# Patient Record
Sex: Male | Born: 1945 | Race: White | Hispanic: No | Smoking: Never smoker
Health system: Southern US, Community
[De-identification: ages and names within clinical notes are randomized; demographics above are authoritative.]

## PROBLEM LIST (undated history)

## (undated) DIAGNOSIS — R269 Unspecified abnormalities of gait and mobility: Secondary | ICD-10-CM

## (undated) DIAGNOSIS — K5732 Diverticulitis of large intestine without perforation or abscess without bleeding: Secondary | ICD-10-CM

## (undated) DIAGNOSIS — C61 Malignant neoplasm of prostate: Secondary | ICD-10-CM

## (undated) DIAGNOSIS — L309 Dermatitis, unspecified: Secondary | ICD-10-CM

## (undated) DIAGNOSIS — T7840XA Allergy, unspecified, initial encounter: Secondary | ICD-10-CM

## (undated) DIAGNOSIS — M545 Low back pain, unspecified: Secondary | ICD-10-CM

## (undated) DIAGNOSIS — M1990 Unspecified osteoarthritis, unspecified site: Secondary | ICD-10-CM

## (undated) DIAGNOSIS — Z1509 Genetic susceptibility to other malignant neoplasm: Secondary | ICD-10-CM

## (undated) DIAGNOSIS — F419 Anxiety disorder, unspecified: Secondary | ICD-10-CM

## (undated) DIAGNOSIS — R159 Full incontinence of feces: Secondary | ICD-10-CM

## (undated) DIAGNOSIS — S62102A Fracture of unspecified carpal bone, left wrist, initial encounter for closed fracture: Secondary | ICD-10-CM

## (undated) DIAGNOSIS — D239 Other benign neoplasm of skin, unspecified: Secondary | ICD-10-CM

## (undated) DIAGNOSIS — Z860100 Personal history of colon polyps, unspecified: Secondary | ICD-10-CM

## (undated) DIAGNOSIS — G629 Polyneuropathy, unspecified: Secondary | ICD-10-CM

## (undated) DIAGNOSIS — K5790 Diverticulosis of intestine, part unspecified, without perforation or abscess without bleeding: Secondary | ICD-10-CM

## (undated) DIAGNOSIS — K5792 Diverticulitis of intestine, part unspecified, without perforation or abscess without bleeding: Secondary | ICD-10-CM

## (undated) DIAGNOSIS — E669 Obesity, unspecified: Secondary | ICD-10-CM

## (undated) DIAGNOSIS — D485 Neoplasm of uncertain behavior of skin: Secondary | ICD-10-CM

## (undated) DIAGNOSIS — L570 Actinic keratosis: Secondary | ICD-10-CM

## (undated) DIAGNOSIS — Z800 Family history of malignant neoplasm of digestive organs: Secondary | ICD-10-CM

## (undated) DIAGNOSIS — F329 Major depressive disorder, single episode, unspecified: Secondary | ICD-10-CM

## (undated) DIAGNOSIS — E785 Hyperlipidemia, unspecified: Secondary | ICD-10-CM

## (undated) DIAGNOSIS — M47816 Spondylosis without myelopathy or radiculopathy, lumbar region: Secondary | ICD-10-CM

## (undated) DIAGNOSIS — K648 Other hemorrhoids: Secondary | ICD-10-CM

## (undated) DIAGNOSIS — K759 Inflammatory liver disease, unspecified: Secondary | ICD-10-CM

## (undated) DIAGNOSIS — H9190 Unspecified hearing loss, unspecified ear: Secondary | ICD-10-CM

## (undated) DIAGNOSIS — J189 Pneumonia, unspecified organism: Secondary | ICD-10-CM

## (undated) DIAGNOSIS — C4490 Unspecified malignant neoplasm of skin, unspecified: Secondary | ICD-10-CM

## (undated) DIAGNOSIS — R51 Headache: Secondary | ICD-10-CM

## (undated) DIAGNOSIS — L989 Disorder of the skin and subcutaneous tissue, unspecified: Secondary | ICD-10-CM

## (undated) DIAGNOSIS — Z973 Presence of spectacles and contact lenses: Secondary | ICD-10-CM

## (undated) DIAGNOSIS — I493 Ventricular premature depolarization: Secondary | ICD-10-CM

## (undated) DIAGNOSIS — G63 Polyneuropathy in diseases classified elsewhere: Secondary | ICD-10-CM

## (undated) DIAGNOSIS — F32A Depression, unspecified: Secondary | ICD-10-CM

## (undated) HISTORY — DX: Diverticulitis of intestine, part unspecified, without perforation or abscess without bleeding: K57.92

## (undated) HISTORY — DX: Polyneuropathy in diseases classified elsewhere: G63

## (undated) HISTORY — DX: Family history of malignant neoplasm of digestive organs: Z80.0

## (undated) HISTORY — DX: Personal history of colonic polyps: Z86.010

## (undated) HISTORY — DX: Obesity, unspecified: E66.9

## (undated) HISTORY — DX: Hyperlipidemia, unspecified: E78.5

## (undated) HISTORY — PX: NASAL SEPTOPLASTY W/ TURBINOPLASTY: SHX2070

## (undated) HISTORY — DX: Spondylosis without myelopathy or radiculopathy, lumbar region: M47.816

## (undated) HISTORY — DX: Disorder of the skin and subcutaneous tissue, unspecified: L98.9

## (undated) HISTORY — PX: ANKLE SURGERY: SHX546

## (undated) HISTORY — DX: Neoplasm of uncertain behavior of skin: D48.5

## (undated) HISTORY — PX: PROSTATE BIOPSY: SHX241

## (undated) HISTORY — DX: Headache: R51

## (undated) HISTORY — DX: Other benign neoplasm of skin, unspecified: D23.9

## (undated) HISTORY — DX: Low back pain, unspecified: M54.50

## (undated) HISTORY — DX: Other hemorrhoids: K64.8

## (undated) HISTORY — PX: TONSILLECTOMY: SUR1361

## (undated) HISTORY — DX: Ventricular premature depolarization: I49.3

## (undated) HISTORY — DX: Inflammatory liver disease, unspecified: K75.9

## (undated) HISTORY — PX: COLONOSCOPY W/ POLYPECTOMY: SHX1380

## (undated) HISTORY — DX: Personal history of colon polyps, unspecified: Z86.0100

## (undated) HISTORY — DX: Diverticulosis of intestine, part unspecified, without perforation or abscess without bleeding: K57.90

## (undated) HISTORY — DX: Major depressive disorder, single episode, unspecified: F32.9

## (undated) HISTORY — DX: Actinic keratosis: L57.0

## (undated) HISTORY — DX: Pneumonia, unspecified organism: J18.9

## (undated) HISTORY — DX: Allergy, unspecified, initial encounter: T78.40XA

## (undated) HISTORY — DX: Unspecified malignant neoplasm of skin, unspecified: C44.90

## (undated) HISTORY — PX: UPPER GASTROINTESTINAL ENDOSCOPY: SHX188

## (undated) HISTORY — PX: COLONOSCOPY: SHX174

## (undated) HISTORY — DX: Genetic susceptibility to other malignant neoplasm: Z15.09

## (undated) HISTORY — DX: Malignant neoplasm of prostate: C61

## (undated) HISTORY — DX: Diverticulitis of large intestine without perforation or abscess without bleeding: K57.32

## (undated) HISTORY — DX: Dermatitis, unspecified: L30.9

## (undated) HISTORY — DX: Unspecified abnormalities of gait and mobility: R26.9

## (undated) HISTORY — DX: Low back pain: M54.5

## (undated) HISTORY — DX: Polyneuropathy, unspecified: G62.9

## (undated) HISTORY — DX: Full incontinence of feces: R15.9

## (undated) HISTORY — PX: SQUAMOUS CELL CARCINOMA EXCISION: SHX2433

## (undated) HISTORY — DX: Depression, unspecified: F32.A

## (undated) HISTORY — DX: Unspecified osteoarthritis, unspecified site: M19.90

---

## 1980-08-30 HISTORY — PX: HERNIA REPAIR: SHX51

## 1986-08-30 HISTORY — PX: UMBILICAL HERNIA REPAIR: SHX196

## 2000-12-14 ENCOUNTER — Ambulatory Visit (HOSPITAL_COMMUNITY): Admission: RE | Admit: 2000-12-14 | Discharge: 2000-12-14 | Payer: Self-pay | Admitting: Gastroenterology

## 2000-12-14 ENCOUNTER — Encounter (INDEPENDENT_AMBULATORY_CARE_PROVIDER_SITE_OTHER): Payer: Self-pay | Admitting: Specialist

## 2004-03-30 ENCOUNTER — Encounter: Admission: RE | Admit: 2004-03-30 | Discharge: 2004-03-30 | Payer: Self-pay | Admitting: Family Medicine

## 2007-07-20 ENCOUNTER — Ambulatory Visit: Payer: Self-pay | Admitting: Internal Medicine

## 2007-08-01 ENCOUNTER — Ambulatory Visit: Payer: Self-pay | Admitting: Internal Medicine

## 2007-08-01 DIAGNOSIS — K648 Other hemorrhoids: Secondary | ICD-10-CM | POA: Insufficient documentation

## 2007-09-23 DIAGNOSIS — K625 Hemorrhage of anus and rectum: Secondary | ICD-10-CM

## 2007-09-23 DIAGNOSIS — E785 Hyperlipidemia, unspecified: Secondary | ICD-10-CM

## 2007-09-23 DIAGNOSIS — R159 Full incontinence of feces: Secondary | ICD-10-CM | POA: Insufficient documentation

## 2007-09-23 DIAGNOSIS — C61 Malignant neoplasm of prostate: Secondary | ICD-10-CM

## 2008-12-27 ENCOUNTER — Encounter: Admission: RE | Admit: 2008-12-27 | Discharge: 2008-12-27 | Payer: Self-pay | Admitting: Family Medicine

## 2009-01-13 ENCOUNTER — Encounter: Admission: RE | Admit: 2009-01-13 | Discharge: 2009-01-13 | Payer: Self-pay | Admitting: Family Medicine

## 2010-06-25 ENCOUNTER — Encounter: Admission: RE | Admit: 2010-06-25 | Discharge: 2010-06-25 | Payer: Self-pay | Admitting: Family Medicine

## 2010-11-05 ENCOUNTER — Other Ambulatory Visit: Payer: Self-pay | Admitting: Family Medicine

## 2010-11-05 DIAGNOSIS — K5792 Diverticulitis of intestine, part unspecified, without perforation or abscess without bleeding: Secondary | ICD-10-CM

## 2010-11-06 ENCOUNTER — Other Ambulatory Visit: Payer: Self-pay | Admitting: Family Medicine

## 2010-11-06 ENCOUNTER — Ambulatory Visit
Admission: RE | Admit: 2010-11-06 | Discharge: 2010-11-06 | Disposition: A | Discharge status: Home / Self Care | Payer: Medicare Other | Source: Ambulatory Visit | Attending: Family Medicine | Admitting: Family Medicine

## 2010-11-06 DIAGNOSIS — K5730 Diverticulosis of large intestine without perforation or abscess without bleeding: Secondary | ICD-10-CM

## 2010-11-06 DIAGNOSIS — K5792 Diverticulitis of intestine, part unspecified, without perforation or abscess without bleeding: Secondary | ICD-10-CM

## 2010-11-06 MED ORDER — IOHEXOL 300 MG/ML  SOLN
125.0000 mL | Freq: Once | INTRAMUSCULAR | Status: AC | PRN
  Administered 2010-11-06: 125 mL via INTRAVENOUS

## 2010-11-09 ENCOUNTER — Other Ambulatory Visit: Payer: Self-pay

## 2010-11-26 ENCOUNTER — Ambulatory Visit
Admission: RE | Admit: 2010-11-26 | Discharge: 2010-11-26 | Disposition: A | Discharge status: Home / Self Care | Payer: Medicare Other | Source: Ambulatory Visit | Attending: Family Medicine | Admitting: Family Medicine

## 2010-11-26 ENCOUNTER — Encounter: Payer: Self-pay | Admitting: Internal Medicine

## 2010-11-26 ENCOUNTER — Other Ambulatory Visit: Payer: Self-pay | Admitting: Family Medicine

## 2010-11-26 DIAGNOSIS — M545 Low back pain, unspecified: Secondary | ICD-10-CM

## 2010-11-26 DIAGNOSIS — R109 Unspecified abdominal pain: Secondary | ICD-10-CM

## 2010-12-01 NOTE — Letter (Signed)
Summary: New Patient letter  [facility]  [address] [place], [place] [address]   Phone: [phone]  Fax: [phone]       11/26/2010 MRN: [id number]  [facility] [address] [place], [place]  [address]  Dear Ronald Edwards,  Welcome to the Gastroenterology Division at [facility].    You are scheduled to see Dr.  Leone Payor on Jan 06, 2011 at 8:45am on the 3rd floor at [facility], [address]  We ask that you try to arrive at our  office 15 minutes prior to your appointment time to allow for check-in.  We would like you to complete the enclosed self-administered evaluation form prior to your visit and bring it with you on the day of your appointment.  We will review it with you.  Also, please bring a complete list of all your medications or, if you prefer, bring the medication bottles and we will list them.  Please bring your insurance card so that we may make a copy of it.  If your insurance requires a referral to see a specialist, please bring your referral form from your primary care physician.  Co-payments are due at the time of your visit and may be paid by cash, check or credit card.     Your office visit will consist of a consult with your physician (includes a physical exam), any laboratory testing he/she may order, scheduling of any necessary diagnostic testing (e.g. x-ray, ultrasound, CT-scan), and scheduling of a procedure (e.g. Endoscopy, Colonoscopy) if required.  Please allow enough time on your schedule to allow for any/all of these possibilities.    If you cannot keep your appointment, please call [phone] to cancel or reschedule prior to your appointment date.  This allows [place] the opportunity to schedule an appointment for another patient in need of care.  If you do not cancel or reschedule by 5 p.m. the business day prior to your appointment date, you will be charged a $50.00 late cancellation/no-show fee.    Thank you for choosing  [facility] for your medical needs.  We appreciate the opportunity to care for you.  Please visit [place] at our website  to learn more about our practice.                     Sincerely,                                                             The Gastroenterology Division

## 2010-12-07 ENCOUNTER — Other Ambulatory Visit (HOSPITAL_COMMUNITY): Payer: Self-pay | Admitting: Family Medicine

## 2010-12-07 DIAGNOSIS — I493 Ventricular premature depolarization: Secondary | ICD-10-CM

## 2010-12-09 ENCOUNTER — Ambulatory Visit (HOSPITAL_COMMUNITY): Payer: Medicare Other | Attending: Family Medicine | Admitting: Radiology

## 2010-12-09 DIAGNOSIS — I493 Ventricular premature depolarization: Secondary | ICD-10-CM

## 2010-12-09 DIAGNOSIS — I4949 Other premature depolarization: Secondary | ICD-10-CM

## 2010-12-09 DIAGNOSIS — I379 Nonrheumatic pulmonary valve disorder, unspecified: Secondary | ICD-10-CM | POA: Insufficient documentation

## 2010-12-09 DIAGNOSIS — E785 Hyperlipidemia, unspecified: Secondary | ICD-10-CM | POA: Insufficient documentation

## 2010-12-09 DIAGNOSIS — I079 Rheumatic tricuspid valve disease, unspecified: Secondary | ICD-10-CM | POA: Insufficient documentation

## 2010-12-10 ENCOUNTER — Encounter (HOSPITAL_COMMUNITY): Payer: Self-pay | Admitting: Family Medicine

## 2011-01-06 ENCOUNTER — Encounter: Payer: Self-pay | Admitting: Internal Medicine

## 2011-01-06 ENCOUNTER — Ambulatory Visit (INDEPENDENT_AMBULATORY_CARE_PROVIDER_SITE_OTHER): Payer: Medicare Other | Admitting: Internal Medicine

## 2011-01-06 VITALS — BP 128/74 | HR 62 | Ht 74.0 in | Wt 270.0 lb

## 2011-01-06 DIAGNOSIS — R159 Full incontinence of feces: Secondary | ICD-10-CM

## 2011-01-06 DIAGNOSIS — K5732 Diverticulitis of large intestine without perforation or abscess without bleeding: Secondary | ICD-10-CM

## 2011-01-06 DIAGNOSIS — Z860100 Personal history of colon polyps, unspecified: Secondary | ICD-10-CM

## 2011-01-06 DIAGNOSIS — C61 Malignant neoplasm of prostate: Secondary | ICD-10-CM

## 2011-01-06 DIAGNOSIS — L259 Unspecified contact dermatitis, unspecified cause: Secondary | ICD-10-CM

## 2011-01-06 DIAGNOSIS — R1032 Left lower quadrant pain: Secondary | ICD-10-CM

## 2011-01-06 DIAGNOSIS — L309 Dermatitis, unspecified: Secondary | ICD-10-CM | POA: Insufficient documentation

## 2011-01-06 HISTORY — DX: Diverticulitis of large intestine without perforation or abscess without bleeding: K57.32

## 2011-01-06 MED ORDER — PEG-KCL-NACL-NASULF-NA ASC-C 100 G PO SOLR: 1.0000 | Freq: Once | ORAL | Status: AC

## 2011-01-06 NOTE — Assessment & Plan Note (Signed)
We know from CT scanning and 20/10 in 2011 he has had this problem. He could have occult diverticulitis that persists. This could be part of his problem. I'm not sure it explains everything come in particular the fatigue may be due to his work schedule and age and overall fitness or lack thereof.  A colonoscopy will be undertaken to try to understand things better. Since he had an adenomatous polyp or 2 in the past, a full colonoscopy rather than sigmoidoscopy will be performed. If he does not have polyps this time I would recommend a routine repeat in 10 years.  I advised him that if he has ongoing diverticulitis, surgical resection on an elective basis would be a reasonable option. We will see what is found and determine a plan after that.

## 2011-01-06 NOTE — Assessment & Plan Note (Signed)
Likely to prescribe a nystatin triamcinolone ointment. He may need a barrier cream or ointment as well. A skin biopsy could be indicated depending upon the clinical course.

## 2011-01-06 NOTE — Assessment & Plan Note (Signed)
Diminutive adenoma or 2 in 2002.

## 2011-01-06 NOTE — Progress Notes (Signed)
Subjective:    Patient ID: Ronald Edwards, male    DOB: 04/20/1946, 65 y.o.   MRN: [id number]  HPI Pleasant 65 year old white man known to me from previous colonoscopy. I am asked to evaluate him because of recurrent abdominal pain and diverticulitis as well as fatigue. He did not identify diverticulosis on December 65 a colonoscopy. Patient had CT documented diverticulitis in 2010 and then 2011, which responded to Cipro though it took some extended treatment. A prostate biopsy at the beginning of this year and then had some fever root type syndrome nausea and vomiting and left lower quadrant pain. He was treated with Cipro, CT scan March 2012 did not show diverticulitis but severe diverticulosis, SI joint sclerosis and fat-filled inguinal hernias. He felt better after a few days. 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 He has been under some stress in that he is trying to sell his business and land though his son is part of the business and will need a job still. Wife has been busy with doctor visits as well. There have been some other family stresses.  He continues this for chronic occasional rectal bleeding. He has known hemorrhoids. He has chronic occasional fecal soiling, and a chronic perianal rash.  Overall he does feel it has improved and is not in any pain at this time.  Prior office visits with PCP, radiology reports, laboratory tests reviewed.  Past Medical History  Diagnosis Date  . Allergic rhinitis   . Skin lesion     atypical, left lower leg  . DJD (degenerative joint disease) of lumbar spine   . Diverticulitis   . Diverticulosis   . Hearing loss   . History of colonic polyps   . Hyperlipemia   . Osteoarthritis   . Prostate cancer   . 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 Low back  pain   . Malaise and fatigue   . Actinic keratosis   . Internal hemorrhoids   . Fecal incontinence   . Pneumonia    Past Surgical History  Procedure Date  . Hernia repair 1982    left  . Umbilical hernia repair 1988  . Prostate biopsy   . Colonoscopy 08/01/2007    internal hemorrhoids  . Colonoscopy w/ polypectomy 2002    diminutive adenoma(s)    reports that he has never smoked. He has never used smokeless tobacco. He reports that he drinks alcohol. He reports that he does not use illicit drugs. family history includes Allergic rhinitis in his daughter; Anxiety disorder in his sister; COPD in his mother; Coronary artery disease in his mother; Diabetes in his mother; and Lung cancer in his father.  There is no history of Colon cancer. Allergies  Allergen Reactions  . Lovastatin Other (See Comments)    fatigue  . Ondansetron Hcl Other (See Comments)    headache  . Penicillins Swelling and Other (See Comments)     sweating  . Pravastatin Other (See Comments)    Arthralgia, Myalgias  . Simvastatin Other (See Comments)     Myalgias         Review of Systems Allergies, osteoarthritis and back pain, night sweats and muscle pains. All other systems are negative or as reviewed in the history of present illness.  Objective:   Physical Exam Obese but well-developed well-nourished white man no acute distress The eyes are anicteric pupils round and Mouth dentition in good repair Neck supple no mass Lungs clear Heart S1-S2 no rubs murmurs or gallops Rectal exam shows a perianal erythematous rash, quite intense. Resting tone is slightly decreased. There is a good squeeze overall. No mass. No significant stool. Lower extremities free of edema. Awake alert and oriented x3. Mood and affect are appropriate.       Assessment & Plan:

## 2011-01-06 NOTE — Assessment & Plan Note (Signed)
Not a problem right now but he said recurrent episodes suggestive of diverticulitis and proven to be so on CT at least two out of three occasions. Colonoscopy will evaluate further. I did not note diverticulosis on his last colonoscopy in 2010.

## 2011-01-06 NOTE — Assessment & Plan Note (Signed)
This remains a problem and is probably causing his perianal dermatitis. Await colonoscopy and rectal exam at that time to reassess.

## 2011-01-06 NOTE — Patient Instructions (Signed)
Colonoscopy A colonoscopy is an exam to evaluate your entire colon. In this exam, your colon is cleansed. A long fiberoptic tube is inserted through your rectum and into your colon. The fiberoptic scope (endoscope) is a long bundle of enclosed and very flexible fibers. These fibers transmit light to the area examined and send images from that area to your caregiver. Discomfort is usually minimal. You may be given a drug to help you sleep (sedative) during or prior to the procedure. This exam helps to detect lumps (tumors), polyps, inflammation, and areas of bleeding. Your caregiver may also take a small piece of tissue (biopsy) that will be examined under a microscope. BEFORE THE PROCEDURE  A clear liquid diet may be required for 2 days before the exam.   Liquid injections (enemas) or laxatives may be required.   A large amount of electrolyte solution may be given to you to drink over a short period of time. This solution is used to clean out your colon.   You should be present 1 prior to your procedure or as directed by your caregiver.   Check in at the admissions desk to fill out necessary forms if not preregistered. There will be consent forms to sign prior to the procedure. If accompanied by friends or family, there is a waiting area for them while you are having your procedure.  LET YOUR CAREGIVER KNOW ABOUT:  Allergies to food or medicine.  Medicines taken, including vitamins, herbs, eyedrops, over-the-counter medicines, and creams.   Use of steroids (by mouth or creams).   Previous problems with anesthetics or numbing medicines.   History of bleeding problems or blood clots.  Previous surgery.   Other health problems, including diabetes and kidney problems.   Possibility of pregnancy, if this applies.   AFTER THE PROCEDURE  If you received a sedative and/or pain medicine, you will need to arrange for someone to drive you home.   Occasionally, there is a little blood passed  with the first bowel movement. DO NOT be concerned.  HOME CARE INSTRUCTIONS  It is not unusual to pass moderate amounts of gas and experience mild abdominal cramping following the procedure. This is due to air being used to inflate your colon during the exam. Walking or a warm pack on your belly (abdomen) may help.   You may resume all normal meals and activities after sedatives and medicines have worn off.   Only take over-the-counter or prescription medicines for pain, discomfort, or fever as directed by your caregiver. DO NOT use aspirin or blood thinners if a biopsy was taken. Consult your caregiver for medicine usage if biopsies were taken.  FINDING OUT THE RESULTS OF YOUR TEST Not all test results are available during your visit. If your test results are not back during the visit, make an appointment with your caregiver to find out the results. Do not assume everything is normal if you have not heard from your caregiver or the medical facility. It is important for you to follow up on all of your test results. SEEK IMMEDIATE MEDICAL CARE IF:  You pass large blood clots or fill a toilet with blood following the procedure. This may also occur 10 to 14 days following the procedure. This is more likely if a biopsy was taken.   You develop abdominal pain that keeps getting worse and cannot be relieved with medicine.  Document Released: 08/13/2000 Document Re-Released: 11/10/2009 [facility] Patient Information 2011 [place], [place].  Your colonoscopy is scheduled on 6//  at 1:30pm We are sending in your MoviPrep to your pharmacy today Cc. Theron Arista Blomgren,MD

## 2011-01-11 ENCOUNTER — Encounter: Payer: Self-pay | Admitting: Internal Medicine

## 2011-01-12 NOTE — Assessment & Plan Note (Signed)
[facility]                         GASTROENTEROLOGY OFFICE NOTE   SHIRO, ELLERMAN                      MRN:          [id number]  DATE:07/20/2007                            DOB:          [birth year]    CHIEF COMPLAINT:  Rectal bleeding.   ASSESSMENT:  A 65 year old white man with intermittent rectal bleeding,  often after he lifts a lot, possibly hemorrhoids.  The patient does have  a history of a diminutive adenomatous colon polyp of the rectum removed  by Dr. Vida Rigger in 2002, must rule out colorectal neoplasia.   PLAN:  Schedule colonoscopy, further plans pending that.   HISTORY:  A 65 year old white man with issues as above.  Over time he  has had some bright red blood or fecal-soiling on the underwear usually  after he lifts objects.  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  He denies any specific constipation or other bowel changes or  abdominal pain.  Note that he remembers being told he had two nodules  and to come back for colonoscopy in 5 years by Dr. Ewing Schlein but apparently  they had contacted him at 3 years and this had confused him somewhat.  I  have performed 2 colonoscopies on his wife as well.   MEDICATIONS:  Pravastatin 10 mg daily.   DRUG ALLERGIES:  PENICILLIN.   PAST MEDICAL HISTORY:  1. Left inguinal hernia repair.  2. Umbilical hernia repair.  3. Dyslipidemia.  4. Elevated PSA with benign biopsies, followed by Dr. Darvin Neighbours.   FAMILY HISTORY:  Mother had diabetes and heart disease.  There is no  family history of colon cancer.   SOCIAL HISTORY:  He is married.  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  Married, 2 sons, 1 daughter.  No alcohol, tobacco, or drugs reported.   REVIEW OF SYSTEMS:  Some occasional back pain, all other systems  negative.   PHYSICAL EXAMINATION:  GENERAL:  Revealed a robust, obese, white male.  VITAL SIGNS:  Height is 6 feet 2 inches, weight 270 pounds (He is  working on  loosing weight.), blood pressure 128/72, pulse 72.  EYES:  Anicteric.  ENT:  Normal dentition for age overall.  NECK:  Supple without thyromegaly, mass.  CHEST:  Clear.  HEART:  S1 S2.  No murmurs or gallops.  ABDOMEN:  Soft, nontender.  No organomegaly or mass.  There are well  healed surgical scars.  RECTAL:  Deferred.  LYMPHATICS:  No neck or supraclavicular nodes.  EXTREMITIES:  No peripheral edema.  SKIN:  The skin on the face has multiple erythematous areas and patches  that look like some sun damaged area.  NEURO/PSYCH:  He is alert and oriented x3.   I have reviewed the pathology report from his colonoscopy in 2002.  I  have also reviewed the labs and letter sent by Dr. Duaine Dredge as well as  his EKG.     Iva Boop, MD,FACG  Electronically Signed    CEG/MedQ  DD: 07/20/2007  DT: 07/20/2007  Job #: [id number]   cc:   Theron Arista  Duaine Dredge, M.D.

## 2011-02-09 ENCOUNTER — Other Ambulatory Visit: Payer: Medicare Other | Admitting: Internal Medicine

## 2011-02-09 ENCOUNTER — Encounter: Payer: Self-pay | Admitting: Internal Medicine

## 2011-02-09 ENCOUNTER — Ambulatory Visit (AMBULATORY_SURGERY_CENTER): Payer: Medicare Other | Admitting: Internal Medicine

## 2011-02-09 VITALS — BP 125/84 | HR 53 | Temp 97.0°F | Resp 20 | Ht 74.0 in | Wt 270.0 lb

## 2011-02-09 DIAGNOSIS — Z86010 Personal history of colonic polyps: Secondary | ICD-10-CM

## 2011-02-09 DIAGNOSIS — K621 Rectal polyp: Secondary | ICD-10-CM

## 2011-02-09 DIAGNOSIS — D128 Benign neoplasm of rectum: Secondary | ICD-10-CM

## 2011-02-09 DIAGNOSIS — K5732 Diverticulitis of large intestine without perforation or abscess without bleeding: Secondary | ICD-10-CM

## 2011-02-09 DIAGNOSIS — L309 Dermatitis, unspecified: Secondary | ICD-10-CM

## 2011-02-09 DIAGNOSIS — D129 Benign neoplasm of anus and anal canal: Secondary | ICD-10-CM

## 2011-02-09 DIAGNOSIS — R1032 Left lower quadrant pain: Secondary | ICD-10-CM

## 2011-02-09 DIAGNOSIS — K5730 Diverticulosis of large intestine without perforation or abscess without bleeding: Secondary | ICD-10-CM

## 2011-02-09 MED ORDER — SODIUM CHLORIDE 0.9 % IV SOLN: 500.0000 mL | INTRAVENOUS | Status: DC

## 2011-02-09 MED ORDER — NYSTATIN-TRIAMCINOLONE 100000-0.1 UNIT/GM-% EX CREA: TOPICAL_CREAM | Freq: Four times a day (QID) | CUTANEOUS | Status: AC

## 2011-02-09 NOTE — Patient Instructions (Addendum)
Nystatin-triamcinolone cream was prescribed for the perianal rash.  You do have diverticulosis but no active diverticulitis. Call Dr. Leone Payor if you get abdominal pain again. Please schedule a follow-up appointment with Dr. Leone Payor for July 2012, to review your diverticulosis and diverticulitis and the rash and fecal incontinence.Resume all medications. Take a tablespoon of Benefiber daily.

## 2011-02-09 NOTE — Progress Notes (Signed)
PATIENT OXYGEN SATURATION DROPPED TO 88%. RN FLATTENED PILLOW, STIMULATED PT ENCOURAGED PT TO TAKE DEEP BREATHS, OXYGEN SATURATION UP TO 94%

## 2011-02-10 ENCOUNTER — Telehealth: Payer: Self-pay

## 2011-02-10 ENCOUNTER — Telehealth: Payer: Self-pay | Admitting: Internal Medicine

## 2011-02-10 NOTE — Telephone Encounter (Signed)
Please call him tomorrow to see how he was doing, see previous notes

## 2011-02-10 NOTE — Telephone Encounter (Signed)
Colonoscopy yesterday showing severe left-sided diverticulosis and a rectal polyp that was cold snared. I spoke to the patient. He has had problems with abdominal pain, some yesterday but that resolved upon awakening. He has passed flatus and has had a small bowel movement without bleeding. He said some sweats but his temperature was 97. The pain is above the umbilicus. It comes and goes but there are long periods of time where he feels okay in fact he does right now. He has not had nausea or vomiting. This is not really like the pain he had prior to the colonoscopy. He has been able to drink liquids but has not had much of an appetite otherwise. He does not have any back pain or pain with respiration. He says his abdomen is not distended in any way.  At this point we have agreed for him to stay on a liquid diet and observe. Should he deteriorate, develop fever nausea vomiting or more severe problems he is to call us back or go to the emergency department right away. We will call him to recheck in the morning and discuss advancing diet et Karie Soda. Further plans pending clinical course.

## 2011-02-10 NOTE — Telephone Encounter (Signed)
Follow up Call- Patient questions:  Do you have a fever, pain , or abdominal swelling? no Pain Score  0 *  Have you tolerated food without any problems? yes  Have you been able to return to your normal activities? no  Do you have any questions about your discharge instructions: Diet   no Medications  no Follow up visit  no  Do you have questions or concerns about your Care? no  Actions: * If pain score is 4 or above: No action needed, pain <4. Pt's wife answered the phone and she said her husband had stomach pain yesterday after he got home.  She woke him up to speak to me this am and he said he had no pain now.  He said his stomach did hurt yesterday rating pain at 5-6 / 10.  He layed down and went to sleep and it was better.  I asked if he passed a good amount of gas.  Per pt. I did pass some gas.  He ate chicken noodle soup and coffee and apple juice.  I advised the pt to avoid fried, fatty, spicy foods this am and eat light at first and see how he tolerates breakfast.  Pt to call if any questions or problems this am.  MAW

## 2011-02-10 NOTE — Telephone Encounter (Signed)
Returned pt's. Call. Spoke with pt's wife. Pt c/o abd. pain on and off several times today lasting couple of hours @ a time. Pt. Is passing flatus, but still having abd. Pain 5-6 on the pain scale . Pt . Has not eaten today and having chills but has drank without problems today.

## 2011-02-11 ENCOUNTER — Telehealth: Payer: Self-pay | Admitting: *Deleted

## 2011-02-11 NOTE — Telephone Encounter (Signed)
Called pt. Back to follow up on yesterdays c/o abd. Pain. Pt. Says he is not having any pain @ this time , only some soreness lower abd. No fever. Encouraged pt. To continue to drink plenty of fluids today. Dr. Leone Payor informed that pt feels better. Told pt to call back if gets worse again.

## 2011-02-11 NOTE — Telephone Encounter (Signed)
Patient advised.

## 2011-02-11 NOTE — Telephone Encounter (Signed)
I spoke with the patient and he states "am doing better than yesterday".  Denies any pain at this time.  He did want you to know that he is having rectal seepage that he is unable to control. This comes and goes, not daily.  He notes this started with his prostate CA tx.  He reports he does do a lot of heavy lifting.  He is having some rectal itching occasionally.  He is eating although he has no appetite.  Denies fever or sweats today.

## 2011-02-11 NOTE — Telephone Encounter (Signed)
ok I am aware of the incontinence issue He is to start benefiber daily and see me in July Wife knows this

## 2011-02-17 ENCOUNTER — Encounter: Payer: Self-pay | Admitting: Internal Medicine

## 2011-02-17 NOTE — Progress Notes (Signed)
Quick Note:  Diminutive hyperplastic polyp Routine repeat colonoscopy 7-10 yeas - hx adenomas (;ast 2002) ______

## 2011-02-19 ENCOUNTER — Encounter: Payer: Self-pay | Admitting: Gastroenterology

## 2011-02-19 ENCOUNTER — Other Ambulatory Visit (INDEPENDENT_AMBULATORY_CARE_PROVIDER_SITE_OTHER): Payer: Medicare Other

## 2011-02-19 ENCOUNTER — Ambulatory Visit (INDEPENDENT_AMBULATORY_CARE_PROVIDER_SITE_OTHER)
Admission: RE | Admit: 2011-02-19 | Discharge: 2011-02-19 | Disposition: A | Discharge status: Home / Self Care | Payer: Medicare Other | Source: Ambulatory Visit | Attending: Gastroenterology | Admitting: Gastroenterology

## 2011-02-19 ENCOUNTER — Ambulatory Visit (INDEPENDENT_AMBULATORY_CARE_PROVIDER_SITE_OTHER): Payer: Medicare Other | Admitting: Gastroenterology

## 2011-02-19 ENCOUNTER — Telehealth: Payer: Self-pay | Admitting: Internal Medicine

## 2011-02-19 DIAGNOSIS — R109 Unspecified abdominal pain: Secondary | ICD-10-CM

## 2011-02-19 LAB — BASIC METABOLIC PANEL
CO2: 27 mEq/L (ref 19–32)
Calcium: 8.9 mg/dL (ref 8.4–10.5)
Chloride: 100 mEq/L (ref 96–112)
Potassium: 4 mEq/L (ref 3.5–5.1)
Sodium: 135 mEq/L (ref 135–145)

## 2011-02-19 LAB — CBC WITH DIFFERENTIAL/PLATELET
Basophils Absolute: 0 10*3/uL (ref 0.0–0.1)
Lymphocytes Relative: 17.2 % (ref 12.0–46.0)
Monocytes Relative: 8.6 % (ref 3.0–12.0)
Neutrophils Relative %: 72.9 % (ref 43.0–77.0)
Platelets: 314 10*3/uL (ref 150.0–400.0)
RDW: 13.6 % (ref 11.5–14.6)
WBC: 9.6 10*3/uL (ref 4.5–10.5)

## 2011-02-19 NOTE — Telephone Encounter (Signed)
Patient will come today at 2 for xray and labs and see Dr Christella Hartigan at 3:30.

## 2011-02-19 NOTE — Patient Instructions (Signed)
You should be feeling better and better.  Will review your labs and xrays from today when they are back.

## 2011-02-19 NOTE — Telephone Encounter (Signed)
I spoke with the patient and he is c/o severe "gas pains" that wake him up at night.  He was awakened last night 5-6 times last evening.  He had a small BM a day and a half ago.  He has had these pains since the colon on 6/12.  See phone notes following colon.  The pain comes and goes he has no appetite.  He has been eating a liquid diet.  Dr Christella Hartigan you are MD of the day.  Please review and advise.  He is asking to be seen or some suggestions for the weekend.

## 2011-02-19 NOTE — Telephone Encounter (Signed)
Should be seen in our office today, me or PA.   Needs to get CBC, bmet, and xrays (plain films, flat and upright) today.  At least labs before visit, but best if he has labs and films

## 2011-02-19 NOTE — Progress Notes (Signed)
HPI: This is a very pleasant 65 year old man  He underwent a colonoscopy with Dr. Leone Payor 10 days ago for personal history of adenomatous polyps. Severe diverticulosis was noted and a small hyperplastic polyp was removed from his rectum he was recommended to have a repeat colonoscopy at 7 year interval. He has had abdominal discomforts since his procedure.   Last night he had a lot of gas pains, improved after he passed gas.  He thinks the colonoscopy has "gotten him out of rhythm"  He talked to Dr. Leone Payor twice since the procedure.  Eating well, moved his bowel 2 days ago.  No fevers or chills.    Today he has felt better since moving around this afternoon.  He has felt pooly since prostate biopsy in Jan 2012; lack of energy.  Slowly he has been getting better.   Just prior to this visit he had CBC, basic metabolic profile, plain abdominal films checked. Those results are not back yet.  Past Medical History:   Allergic rhinitis                                            Skin lesion                                                    Comment:atypical, left lower leg   DJD (degenerative joint disease) of lumbar spi*              Diverticulitis                                               Diverticulosis                                               Hearing loss                                                 History of colonic polyps                                    Hyperlipemia                                                 Osteoarthritis                                               Prostate cancer  PVC's (premature ventricular contractions)                     Comment:with 4 beat VT   Low back pain                                                Malaise and fatigue                                          Actinic keratosis                                            Internal hemorrhoids                                         Fecal  incontinence                                           Pneumonia                                                    Perianal dermatitis                                         Past Surgical History:   HERNIA REPAIR                                   1982           Comment:left   UMBILICAL HERNIA REPAIR                         1988         PROSTATE BIOPSY                                              COLONOSCOPY                                     08/01/2007      Comment:internal hemorrhoids   COLONOSCOPY W/ POLYPECTOMY                      2002           Comment:diminutive adenoma(s)   COLONOSCOPY W/ POLYPECTOMY                      02/09/11  Comment:rectal hyperplastic polyp, diverticulosis -               left side   reports that he has never smoked. He has never used smokeless tobacco. He reports that he does not drink alcohol or use illicit drugs.  family history includes Allergic rhinitis in his daughter; Anxiety disorder in his sister; COPD in his mother; Coronary artery disease in his mother; Diabetes in his mother; Lung cancer in his father; and Stomach cancer in his paternal grandmother.  There is no history of Colon cancer.    Current medicines and allergies were reviewed in [facility]    Physical Exam: BP 122/68  Pulse 60  Temp(Src) 98.8 F (37.1 C) (Oral)  Ht 6\' 2"  (1.88 m)  Wt 266 lb (120.657 kg)  BMI 34.15 kg/m2 Constitutional: generally well-appearing Psychiatric: alert and oriented x3 Abdomen: soft, nontender, nondistended, no obvious ascites, no peritoneal signs, normal bowel sounds     Assessment and plan: 65 y.o. male with bowel irregularity since colonoscopy 10 days ago  Clinically he does not seem to have suffered any significant complication such as a perforation from his procedure. He did have severe diverticulosis noted. Perhaps he had some resulting spasm, edema in the diverticular segments. He really has no tenderness on examination he has  not been having fevers. He had labs checked just prior to this visit as well as an abdominal x-ray and when those are back we will contact him.

## 2011-03-18 ENCOUNTER — Ambulatory Visit (INDEPENDENT_AMBULATORY_CARE_PROVIDER_SITE_OTHER): Payer: Medicare Other | Admitting: Internal Medicine

## 2011-03-18 ENCOUNTER — Encounter: Payer: Self-pay | Admitting: Internal Medicine

## 2011-03-18 DIAGNOSIS — L309 Dermatitis, unspecified: Secondary | ICD-10-CM

## 2011-03-18 DIAGNOSIS — R5381 Other malaise: Secondary | ICD-10-CM

## 2011-03-18 DIAGNOSIS — K5732 Diverticulitis of large intestine without perforation or abscess without bleeding: Secondary | ICD-10-CM

## 2011-03-18 DIAGNOSIS — L259 Unspecified contact dermatitis, unspecified cause: Secondary | ICD-10-CM

## 2011-03-18 DIAGNOSIS — R5383 Other fatigue: Secondary | ICD-10-CM | POA: Insufficient documentation

## 2011-03-18 DIAGNOSIS — R159 Full incontinence of feces: Secondary | ICD-10-CM

## 2011-03-18 MED ORDER — PSYLLIUM HUSK POWD: Status: AC

## 2011-03-18 NOTE — Assessment & Plan Note (Signed)
No evidence of active diverticulitis now.

## 2011-03-18 NOTE — Patient Instructions (Signed)
Ask your wife if you snore and ask Dr. Duaine Dredge about the possibility of sleep apnea. It is a cause of fatigue. Start psyllium 1 teaspoon a day mixed in 6-8 ounces of water nightly and increase by a teaspoon every week until you get to 3 teaspoons or 1 tablespoon in the water nightly. I am hoping this will bulk up the stool and reduce incontinence even more than it has. Iva Boop, MD, [facility]

## 2011-03-18 NOTE — Assessment & Plan Note (Signed)
Multifactorial - age, weight ? Sleep apnea, metoprolol. Think much of late problems are recent  situational stressors. Interesting that he felt much better when he was away [place] in [place].

## 2011-03-18 NOTE — Assessment & Plan Note (Addendum)
Improved - continue Nystatin-triamcinolone Psyllium added to aid fecal incontinence

## 2011-03-18 NOTE — Assessment & Plan Note (Signed)
Etiology not entirely clear but chronic issue Add psyllium, he is better

## 2011-03-18 NOTE — Progress Notes (Signed)
  Subjective:    Patient ID: TEGH FRANEK, male    DOB: Jul 11, 1946, 65 y.o.   MRN: [id number]  HPI He says he is or has been doing "fair" Has been weak overall and ?'s if heat is causing some of this. Sleeps ok. Still working but cannot do as much as he used to. He did have some pain after his colonoscopy. Since first colonoscopy 10 years ago he has not had a regular AM bowel movement.   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

## 2012-01-12 IMAGING — CR DG ABDOMEN 2V
3 series · 3 of 3 positions shown · non-contrast
Comparison: Scout image for abdominal CT scan dated 11/06/2010

CLINICAL DATA: Nominal pain.  Constipation.

ABDOMEN - 2 VIEW

[view not recorded (1 of 3)]
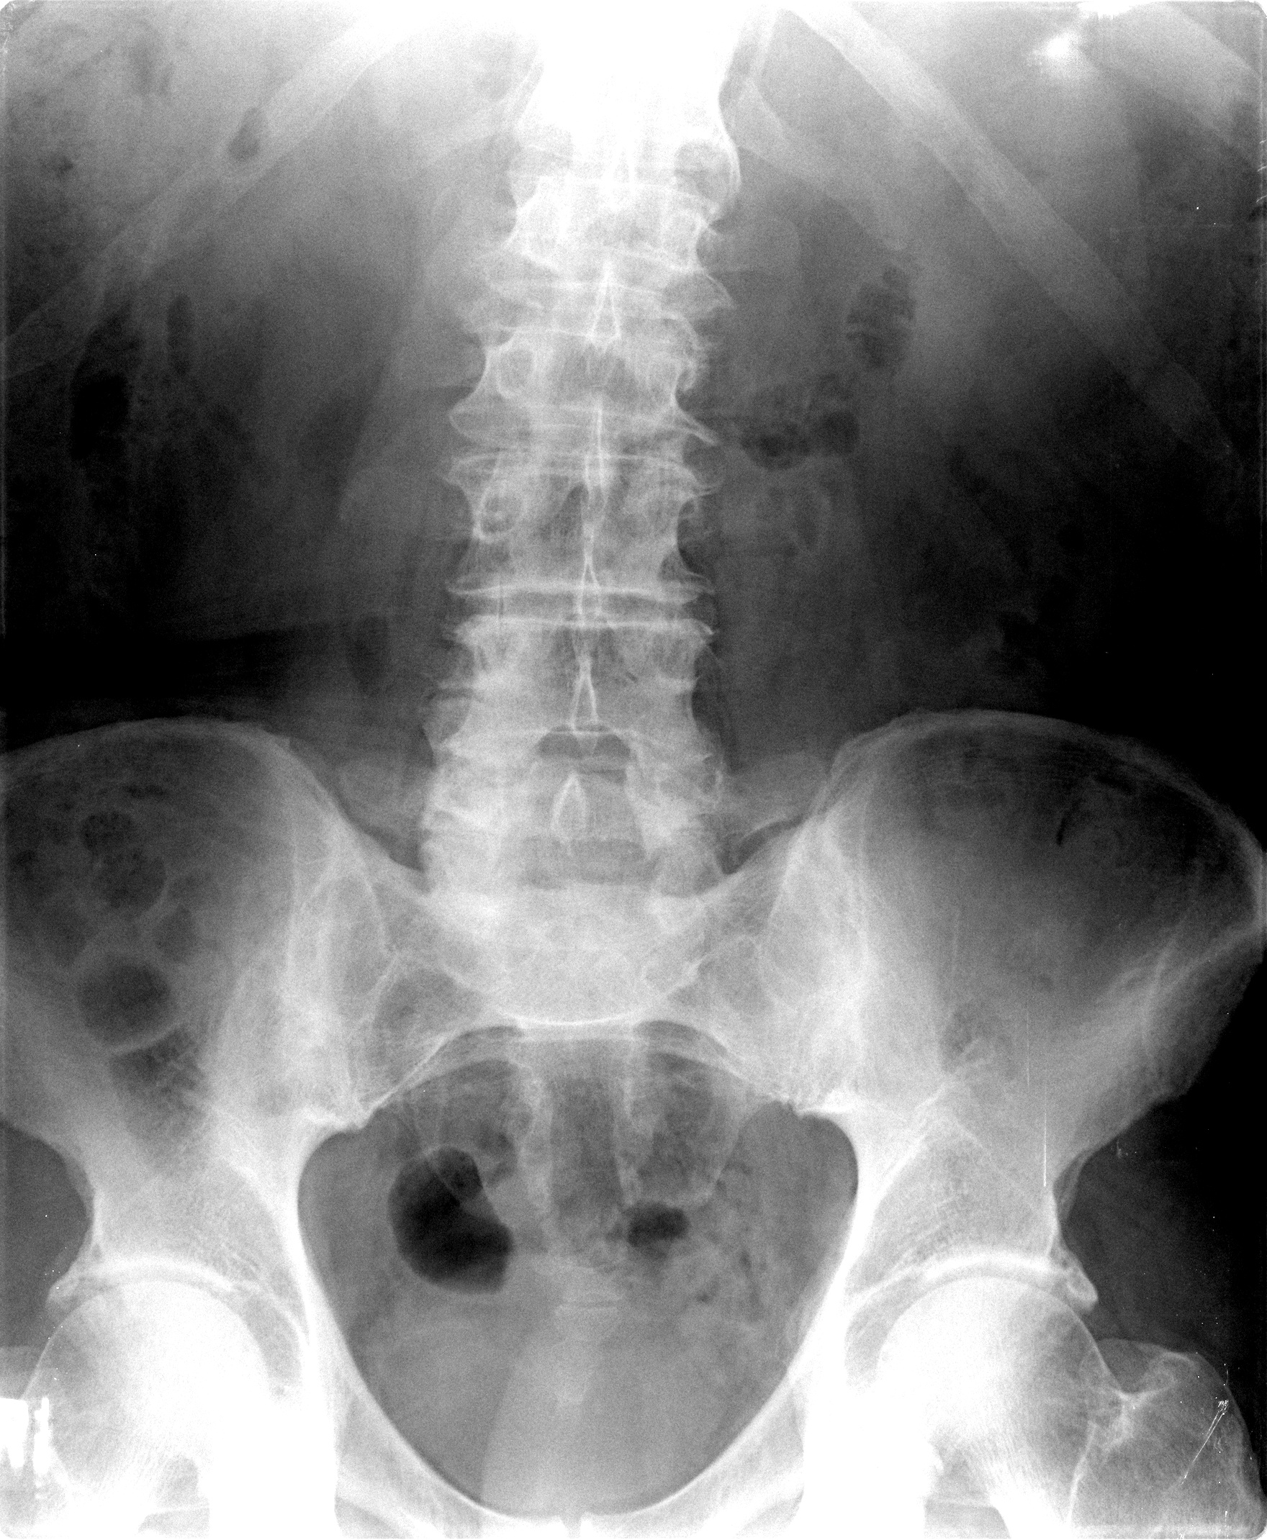

[view not recorded (2 of 3)]
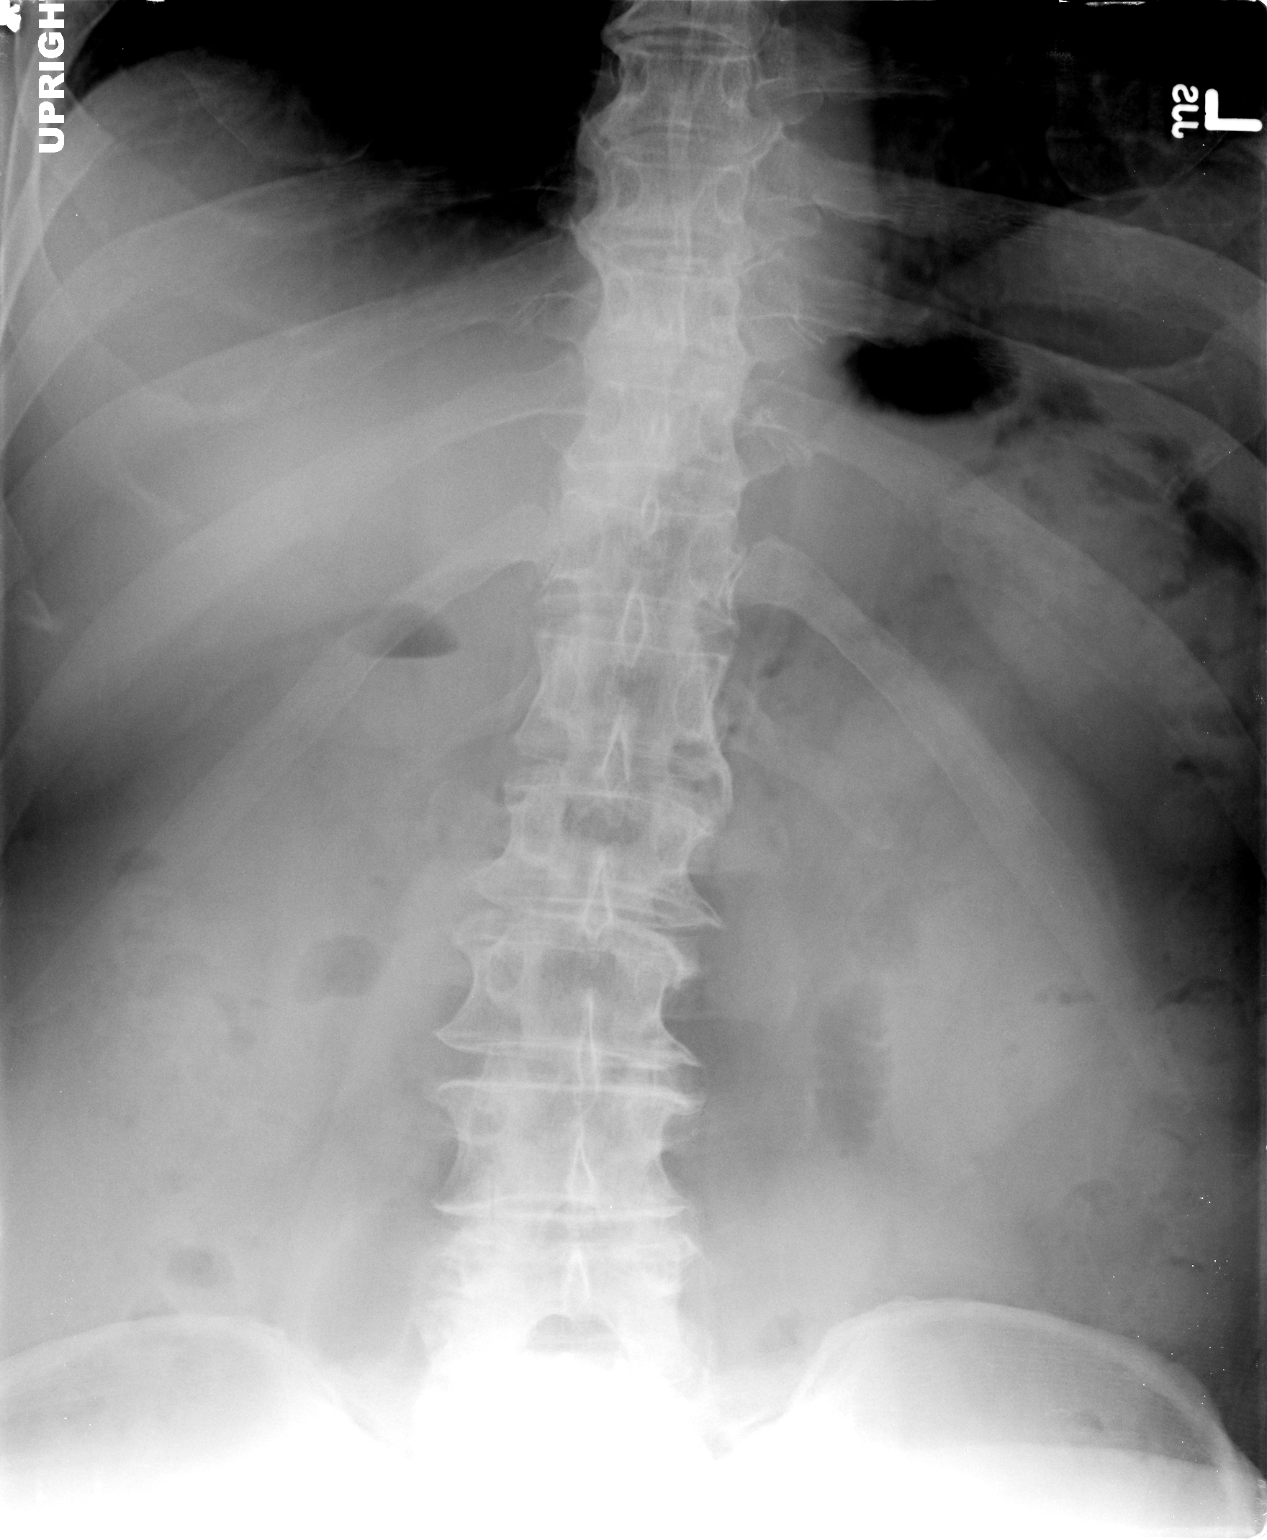

[view not recorded (3 of 3)]
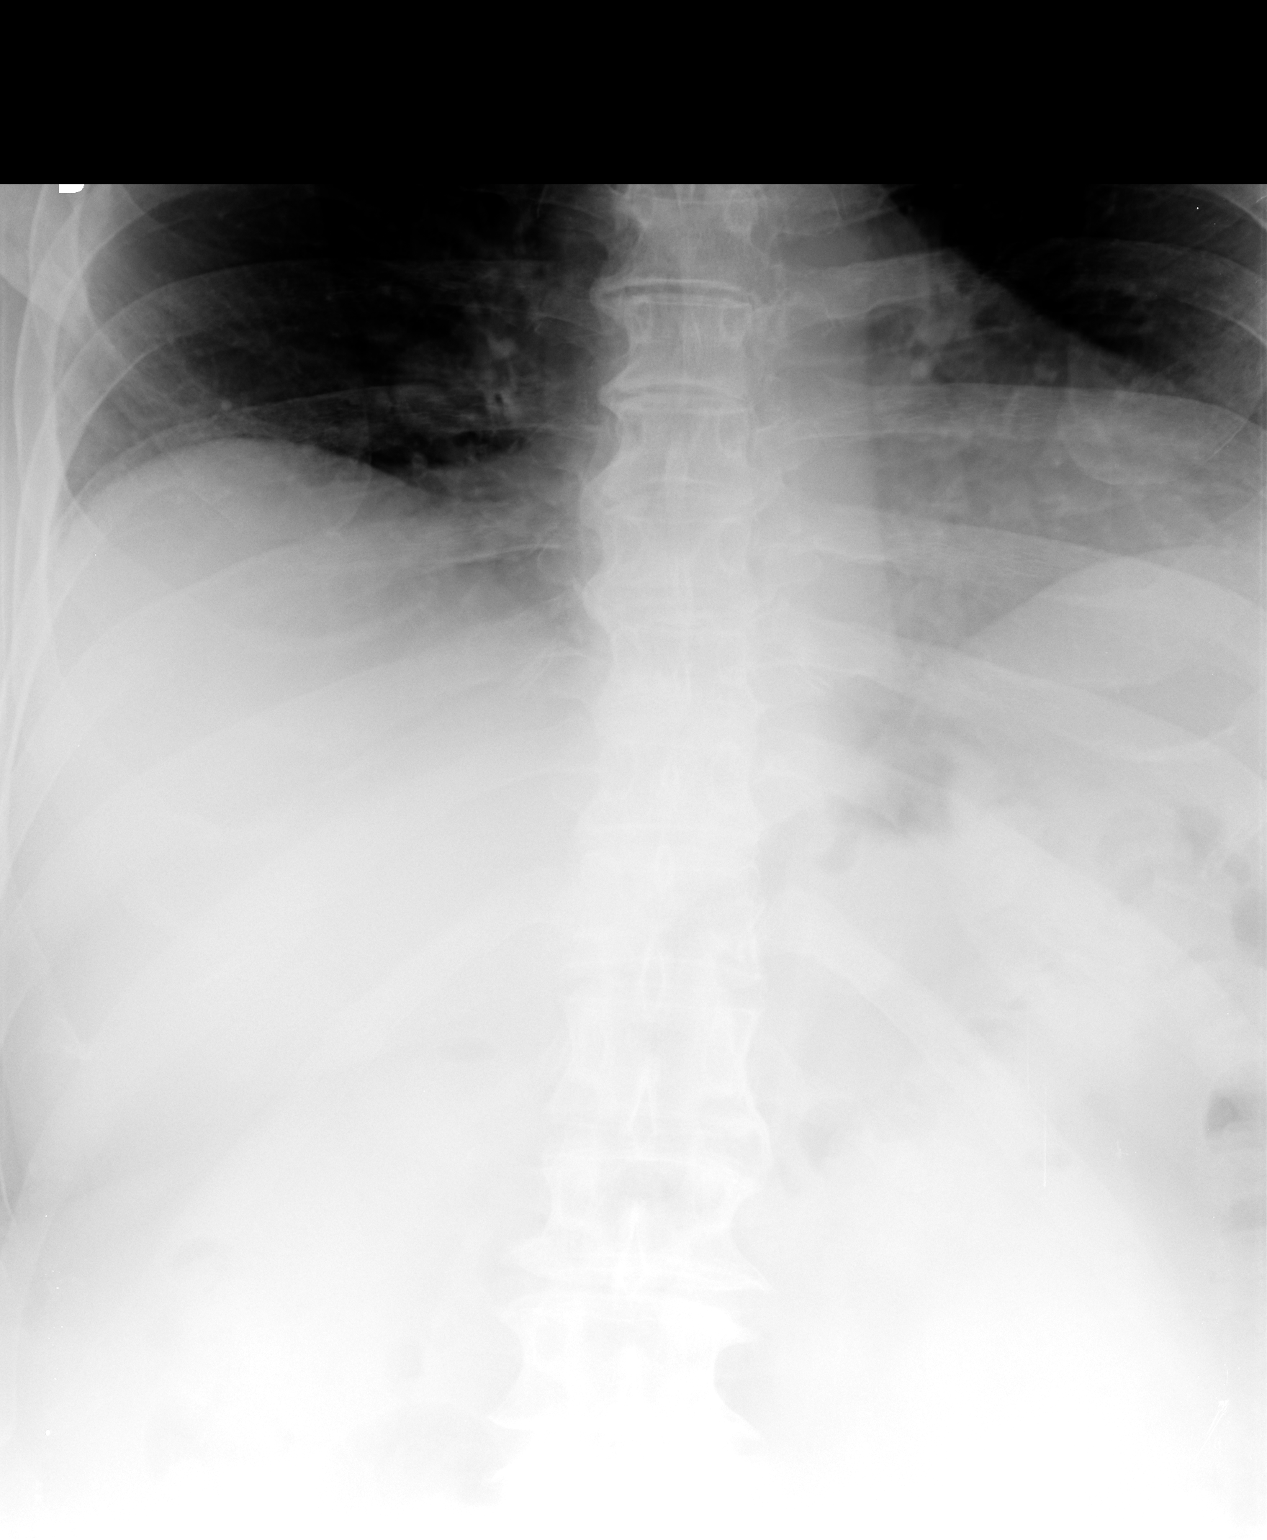

[3 of 3 positions shown; findings below may reference images not displayed]

FINDINGS: There is no free air or free fluid in the abdomen.  There
is only a small amount of air scattered throughout nondistended
loops of large and small bowel.

No worrisome abdominal calcifications.  No acute osseous
abnormalities.
IMPRESSION: Benign-appearing abdomen.

## 2012-05-04 DIAGNOSIS — R269 Unspecified abnormalities of gait and mobility: Secondary | ICD-10-CM | POA: Insufficient documentation

## 2012-05-12 ENCOUNTER — Ambulatory Visit
Admission: RE | Admit: 2012-05-12 | Discharge: 2012-05-12 | Disposition: A | Discharge status: Home / Self Care | Payer: Medicare Other | Source: Ambulatory Visit | Attending: Family Medicine | Admitting: Family Medicine

## 2012-05-12 ENCOUNTER — Other Ambulatory Visit: Payer: Self-pay | Admitting: Family Medicine

## 2012-05-12 DIAGNOSIS — R0981 Nasal congestion: Secondary | ICD-10-CM

## 2012-05-12 DIAGNOSIS — R05 Cough: Secondary | ICD-10-CM

## 2012-05-16 ENCOUNTER — Other Ambulatory Visit: Payer: Medicare Other

## 2012-05-16 ENCOUNTER — Ambulatory Visit
Admission: RE | Admit: 2012-05-16 | Discharge: 2012-05-16 | Disposition: A | Discharge status: Home / Self Care | Payer: Medicare Other | Source: Ambulatory Visit | Attending: Family Medicine | Admitting: Family Medicine

## 2012-05-16 DIAGNOSIS — R0981 Nasal congestion: Secondary | ICD-10-CM

## 2012-09-29 ENCOUNTER — Other Ambulatory Visit: Payer: Self-pay | Admitting: Family Medicine

## 2012-09-29 ENCOUNTER — Ambulatory Visit
Admission: RE | Admit: 2012-09-29 | Discharge: 2012-09-29 | Disposition: A | Discharge status: Home / Self Care | Payer: Medicare Other | Source: Ambulatory Visit | Attending: Family Medicine | Admitting: Family Medicine

## 2012-09-29 DIAGNOSIS — J329 Chronic sinusitis, unspecified: Secondary | ICD-10-CM

## 2012-10-21 ENCOUNTER — Encounter: Payer: Self-pay | Admitting: Neurology

## 2012-10-21 DIAGNOSIS — R269 Unspecified abnormalities of gait and mobility: Secondary | ICD-10-CM

## 2012-11-21 ENCOUNTER — Ambulatory Visit (INDEPENDENT_AMBULATORY_CARE_PROVIDER_SITE_OTHER): Payer: Medicare Other | Admitting: Neurology

## 2012-11-21 ENCOUNTER — Encounter: Payer: Self-pay | Admitting: Neurology

## 2012-11-21 VITALS — BP 120/70 | HR 55 | Ht 73.75 in | Wt 269.0 lb

## 2012-11-21 DIAGNOSIS — G63 Polyneuropathy in diseases classified elsewhere: Secondary | ICD-10-CM

## 2012-11-21 HISTORY — DX: Polyneuropathy in diseases classified elsewhere: G63

## 2012-11-21 NOTE — Progress Notes (Signed)
   Reason for visit: Peripheral neuropathy  Ronald Edwards is an 67 y.o. male  History of present illness:  Ronald Edwards is a 67 year old right-handed white male with a history of sensory alteration involving the feet. The patient has noted a spongy sensation on the bottom of the feet with walking, and hot and cold sensations in the feet at night. The patient was on Lipitor, but he stopped the medication several months ago. Over time, he has actually felt much better, but the slight spongy feeling on the bottom of the feet has persisted. The patient is sleeping well, and he has no discomfort in the feet. The patient reports no significant issues with balance, and he has not had any falls. The patient returns for an evaluation. The patient in the past was given a prescription for nortriptyline and gabapentin, but he has not taken either one of these medications.  ROS:  Out of a complete 14 system review of symptoms, the patient complains only of the following symptoms, and all other reviewed systems are negative.  Joint pain Moles  Blood pressure 120/70, pulse 55, height 6' 1.75" (1.873 m), weight 269 lb (122.018 kg).  Physical Exam  General: The patient is alert and cooperative at the time of the examination. The patient is moderately obese.  Skin: No significant peripheral edema is noted.   Neurologic Exam  Cranial nerves: Facial symmetry is present. Speech is normal, no aphasia or dysarthria is noted. Extraocular movements are full. Visual fields are full.  Motor: The patient has good strength in all 4 extremities.  Coordination: The patient has good finger-nose-finger and heel-to-shin bilaterally.  Gait and station: The patient has a normal gait. Tandem gait is slightly unsteady. Romberg is negative. No drift is seen.  Reflexes: Deep tendon reflexes are symmetric, but are depressed.   Assessment/Plan:  1. 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 The patient likely has a low-grade neuropathy or a small fiber neuropathy. The patient has gone off of Lipitor, and his symptoms have improved. This could potentially be the source of his neuropathy symptoms. The patient is doing much better at this time, and he will followup through this office if needed. Prior blood work done was unremarkable.  Marlan Palau MD 11/21/2012 4:34 PM For

## 2014-12-19 ENCOUNTER — Ambulatory Visit
Admission: RE | Admit: 2014-12-19 | Discharge: 2014-12-19 | Disposition: A | Discharge status: Home / Self Care | Payer: Medicare Other | Source: Ambulatory Visit | Attending: Family Medicine | Admitting: Family Medicine

## 2014-12-19 ENCOUNTER — Other Ambulatory Visit: Payer: Self-pay | Admitting: Family Medicine

## 2014-12-19 DIAGNOSIS — M542 Cervicalgia: Secondary | ICD-10-CM

## 2014-12-19 DIAGNOSIS — M79602 Pain in left arm: Secondary | ICD-10-CM

## 2014-12-25 ENCOUNTER — Other Ambulatory Visit: Payer: Self-pay | Admitting: Family Medicine

## 2014-12-25 DIAGNOSIS — R0989 Other specified symptoms and signs involving the circulatory and respiratory systems: Secondary | ICD-10-CM

## 2015-01-13 ENCOUNTER — Ambulatory Visit
Admission: RE | Admit: 2015-01-13 | Discharge: 2015-01-13 | Disposition: A | Discharge status: Home / Self Care | Payer: Medicare Other | Source: Ambulatory Visit | Attending: Family Medicine | Admitting: Family Medicine

## 2015-01-13 DIAGNOSIS — R0989 Other specified symptoms and signs involving the circulatory and respiratory systems: Secondary | ICD-10-CM

## 2015-07-09 ENCOUNTER — Encounter: Payer: Self-pay | Admitting: Internal Medicine

## 2016-01-20 ENCOUNTER — Other Ambulatory Visit (HOSPITAL_COMMUNITY): Payer: Self-pay | Admitting: Family Medicine

## 2016-01-20 ENCOUNTER — Ambulatory Visit (HOSPITAL_COMMUNITY)
Admission: RE | Admit: 2016-01-20 | Discharge: 2016-01-20 | Disposition: A | Discharge status: Home / Self Care | Payer: Medicare Other | Source: Ambulatory Visit | Attending: Vascular Surgery | Admitting: Vascular Surgery

## 2016-01-20 DIAGNOSIS — I6523 Occlusion and stenosis of bilateral carotid arteries: Secondary | ICD-10-CM | POA: Insufficient documentation

## 2016-01-20 DIAGNOSIS — E785 Hyperlipidemia, unspecified: Secondary | ICD-10-CM | POA: Diagnosis not present

## 2016-01-23 ENCOUNTER — Encounter (HOSPITAL_COMMUNITY): Payer: Medicare Other

## 2016-11-30 ENCOUNTER — Other Ambulatory Visit: Payer: Self-pay | Admitting: Urology

## 2016-11-30 DIAGNOSIS — C61 Malignant neoplasm of prostate: Secondary | ICD-10-CM

## 2016-12-14 ENCOUNTER — Other Ambulatory Visit: Payer: Medicare Other

## 2016-12-22 ENCOUNTER — Other Ambulatory Visit: Payer: Medicare Other

## 2016-12-23 ENCOUNTER — Other Ambulatory Visit: Payer: Medicare Other

## 2017-01-04 ENCOUNTER — Other Ambulatory Visit: Payer: Medicare Other

## 2017-01-21 ENCOUNTER — Ambulatory Visit
Admission: RE | Admit: 2017-01-21 | Discharge: 2017-01-21 | Disposition: A | Discharge status: Home / Self Care | Payer: Medicare Other | Source: Ambulatory Visit | Attending: Urology | Admitting: Urology

## 2017-01-21 ENCOUNTER — Other Ambulatory Visit: Payer: Medicare Other

## 2017-01-21 DIAGNOSIS — C61 Malignant neoplasm of prostate: Secondary | ICD-10-CM

## 2017-01-21 MED ORDER — GADOBENATE DIMEGLUMINE 529 MG/ML IV SOLN
20.0000 mL | Freq: Once | INTRAVENOUS | Status: AC | PRN
  Administered 2017-01-21: 20 mL via INTRAVENOUS

## 2017-01-25 ENCOUNTER — Other Ambulatory Visit: Payer: Medicare Other

## 2017-04-13 ENCOUNTER — Encounter: Payer: Self-pay | Admitting: Internal Medicine

## 2017-08-13 ENCOUNTER — Encounter: Payer: Self-pay | Admitting: Internal Medicine

## 2017-10-18 ENCOUNTER — Encounter: Payer: Self-pay | Admitting: Internal Medicine

## 2017-11-02 ENCOUNTER — Encounter: Payer: Self-pay | Admitting: Internal Medicine

## 2017-11-10 ENCOUNTER — Telehealth: Payer: Self-pay | Admitting: Internal Medicine

## 2017-11-10 NOTE — Telephone Encounter (Signed)
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 Patient recall due in June.

## 2017-11-10 NOTE — Telephone Encounter (Signed)
Yes ok to schedule.

## 2017-11-17 ENCOUNTER — Encounter: Payer: Self-pay | Admitting: Internal Medicine

## 2017-11-17 NOTE — Telephone Encounter (Signed)
Patient was called and scheduled for next recall colon.

## 2018-01-16 ENCOUNTER — Other Ambulatory Visit: Payer: Self-pay

## 2018-01-16 ENCOUNTER — Ambulatory Visit (AMBULATORY_SURGERY_CENTER): Payer: Self-pay | Admitting: *Deleted

## 2018-01-16 VITALS — Ht 72.0 in | Wt 274.0 lb

## 2018-01-16 DIAGNOSIS — Z1211 Encounter for screening for malignant neoplasm of colon: Secondary | ICD-10-CM

## 2018-01-16 NOTE — Progress Notes (Signed)
No egg or soy allergy known to patient  No issues with past sedation with any surgeries  or procedures, no intubation problems  No diet pills per patient No home 02 use per patient  No blood thinners per patient  Pt denies issues with constipation  No A fib or A flutter  EMMI video sent to pt's e mail  Pt. Doesn't have an email.

## 2018-01-21 ENCOUNTER — Other Ambulatory Visit (INDEPENDENT_AMBULATORY_CARE_PROVIDER_SITE_OTHER): Payer: Self-pay | Admitting: Otolaryngology

## 2018-01-21 DIAGNOSIS — J329 Chronic sinusitis, unspecified: Secondary | ICD-10-CM

## 2018-01-23 ENCOUNTER — Encounter: Payer: Self-pay | Admitting: Internal Medicine

## 2018-01-30 ENCOUNTER — Ambulatory Visit (AMBULATORY_SURGERY_CENTER): Payer: Medicare HMO | Admitting: Internal Medicine

## 2018-01-30 ENCOUNTER — Encounter: Payer: Self-pay | Admitting: Internal Medicine

## 2018-01-30 ENCOUNTER — Other Ambulatory Visit: Payer: Self-pay

## 2018-01-30 VITALS — BP 116/62 | HR 55 | Temp 98.4°F | Resp 16 | Ht 72.0 in | Wt 274.0 lb

## 2018-01-30 DIAGNOSIS — K621 Rectal polyp: Secondary | ICD-10-CM

## 2018-01-30 DIAGNOSIS — Z1211 Encounter for screening for malignant neoplasm of colon: Secondary | ICD-10-CM

## 2018-01-30 DIAGNOSIS — D128 Benign neoplasm of rectum: Secondary | ICD-10-CM

## 2018-01-30 DIAGNOSIS — D485 Neoplasm of uncertain behavior of skin: Secondary | ICD-10-CM

## 2018-01-30 DIAGNOSIS — Z86010 Personal history of colonic polyps: Secondary | ICD-10-CM | POA: Diagnosis not present

## 2018-01-30 MED ORDER — SODIUM CHLORIDE 0.9 % IV SOLN: 500.0000 mL | Freq: Once | INTRAVENOUS | Status: DC

## 2018-01-30 NOTE — Op Note (Signed)
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 The patient's tolerance of                            previous anesthesia was also reviewed. The risks                            and benefits of the procedure and the sedation                            options and risks were discussed with the patient.                            All questions were answered, and informed consent                            was obtained. Prior Anticoagulants: The patient has                            taken no previous anticoagulant or antiplatelet                            agents. ASA Grade Assessment: III - A patient with                            severe systemic disease. After reviewing the risks                            and benefits, the patient was deemed in  satisfactory condition to undergo the procedure.                           After obtaining informed consent, the colonoscope                            was passed under direct vision. Throughout the                            procedure, the patient's blood pressure, pulse, and                            oxygen saturations were monitored continuously. The                            Model  CF-HQ190L [id number]) scope was introduced                            through the anus and advanced to the the cecum,                            identified by appendiceal orifice and ileocecal                            valve. The colonoscopy was performed without                            difficulty. The patient tolerated the procedure                            well. The quality of the bowel preparation was                            good. The ileocecal valve, appendiceal orifice, and                            rectum were photographed. The bowel preparation                            used was Miralax. Scope In: 11:03:09 AM Scope Out: 11:15:35 AM Scope Withdrawal Time: 0 hours 10 minutes 23 seconds  Total Procedure Duration: 0 hours 12 minutes 26 seconds  Findings:                 A diminutive polyp was found in the rectum. The                            polyp was sessile. The polyp was removed with a                            cold snare. Resection and retrieval were complete.                            Verification of patient identification for the  specimen was done. Estimated blood loss was minimal.                           Many small and large-mouthed diverticula were found                            in the sigmoid colon, descending colon, transverse                            colon and ascending colon.                           The exam was otherwise without abnormality on                            direct and retroflexion views. Complications:            No immediate complications. Estimated Blood Loss:     Estimated blood loss was minimal. Impression:               - One diminutive polyp in the rectum, removed with                            a cold snare. Resected and retrieved.                           - Severe diverticulosis in the sigmoid colon, in                            the descending colon, in the transverse colon and                             in the ascending colon.                           - The examination was otherwise normal on direct                            and retroflexion views.                           - Personal history of colonic polyp - adenoma 2002. Recommendation:           - Patient has a contact number available for                            emergencies. The signs and symptoms of potential                            delayed complications were discussed with the                            patient. Return to normal activities tomorrow.  Written discharge instructions were provided to the                            patient.                           - Resume previous diet.                           - Continue present medications.                           - Repeat colonoscopy is recommended for                            surveillance. The colonoscopy date will be                            determined after pathology results from today's                            exam become available for review.                           - ? if he has genetic testing to confirm Muir-Torre                            Syndrom or it is suspected based upon type of skin                            cancer - need clarification as it would impact                            follow-up and could indicate a need for EGd Gatha Mayer, MD 01/30/2018 11:25:09 AM This report has been signed electronically.

## 2018-01-30 NOTE — Progress Notes (Signed)
Report to PACU, RN, vss, BBS= Clear.  

## 2018-01-30 NOTE — Progress Notes (Signed)
Pt's states no medical or surgical changes since previsit or office visit. 

## 2018-01-30 NOTE — Progress Notes (Signed)
Called to room to assist during endoscopic procedure.  Patient ID and intended procedure confirmed with present staff. Received instructions for my participation in the procedure from the performing physician.  

## 2018-01-30 NOTE — Patient Instructions (Addendum)
I found and removed one tiny polyp - you still have diverticulosis 9that does not go away).  I will let you know pathology results and when to have another routine colonoscopy by mail and/or My Chart.  In Muir-Torre Syndrome colonoscopy every 1-2 years is recommended - if blood tests have shown the genetic abnormality associated with it. I need to know if you have had blood tests looking for the genetic abnormalities.  I appreciate the opportunity to care for you. Gatha Mayer, MD, [facility]   Please read handouts on polyps and diverticulosis.   YOU HAD AN ENDOSCOPIC PROCEDURE TODAY AT [facility]:   Refer to the procedure report that was given to you for any specific questions about what was found during the examination.  If the procedure report does not answer your questions, please call your gastroenterologist to clarify.  If you requested that your care partner not be given the details of your procedure findings, then the procedure report has been included in a sealed envelope for you to review at your convenience later.  YOU SHOULD EXPECT: Some feelings of bloating in the abdomen. Passage of more gas than usual.  Walking can help get rid of the air that was put into your GI tract during the procedure and reduce the bloating. If you had a lower endoscopy (such as a colonoscopy or flexible sigmoidoscopy) you may notice spotting of blood in your stool or on the toilet paper. If you underwent a bowel prep for your procedure, you may not have a normal bowel movement for a few days.  Please Note:  You might notice some irritation and congestion in your nose or some drainage.  This is from the oxygen used during your procedure.  There is no need for concern and it should clear up in a day or so.  SYMPTOMS TO REPORT IMMEDIATELY:   Following lower endoscopy (colonoscopy or flexible sigmoidoscopy):  Excessive amounts of blood in the stool  Significant tenderness or  worsening of abdominal pains  Swelling of the abdomen that is new, acute  Fever of 100F or higher    For urgent or emergent issues, a gastroenterologist can be reached at any hour by calling [phone].   DIET:  We do recommend a small meal at first, but then you may proceed to your regular diet.  Drink plenty of fluids but you should avoid alcoholic beverages for 24 hours.  ACTIVITY:  You should plan to take it easy for the rest of today and you should NOT DRIVE or use heavy machinery until tomorrow (because of the sedation medicines used during the test).    FOLLOW UP: Our staff will call the number listed on your records the next business day following your procedure to check on you and address any questions or concerns that you may have regarding the information given to you following your procedure. If we do not reach you, we will leave a message.  However, if you are feeling well and you are not experiencing any problems, there is no need to return our call.  We will assume that you have returned to your regular daily activities without incident.  If any biopsies were taken you will be contacted by phone or by letter within the next 1-3 weeks.  Please call us at [phone] if you have not heard about the biopsies in 3 weeks.    SIGNATURES/CONFIDENTIALITY: You and/or your care partner have signed paperwork which will be  entered into your electronic medical record.  These signatures attest to the fact that that the information above on your After Visit Summary has been reviewed and is understood.  Full responsibility of the confidentiality of this discharge information lies with you and/or your care-partner. 

## 2018-01-31 ENCOUNTER — Telehealth: Payer: Self-pay | Admitting: *Deleted

## 2018-01-31 NOTE — Telephone Encounter (Signed)
  Follow up Call-  Call back number 01/30/2018  Post procedure Call Back phone  # [phone]  Permission to leave phone message Yes  Some recent data might be hidden     Patient questions:  Do you have a fever, pain , or abdominal swelling? No. Pain Score  0 *  Have you tolerated food without any problems? Yes.    Have you been able to return to your normal activities? Yes.    Do you have any questions about your discharge instructions: Diet   No. Medications  No. Follow up visit  No.  Do you have questions or concerns about your Care? No.  Actions: * If pain score is 4 or above: No action needed, pain <4.

## 2018-01-31 NOTE — Telephone Encounter (Signed)
Unable to complete call.

## 2018-02-08 ENCOUNTER — Telehealth: Payer: Self-pay | Admitting: Internal Medicine

## 2018-02-08 NOTE — Telephone Encounter (Signed)
Patient notified that no acute problems and that will send him a letter when he reviews.

## 2018-02-12 NOTE — Progress Notes (Signed)
Please contact Dr. Deboraha Sprang office - ask if they have records of dermatology notes and can they share them  Also want to know if the dermatologist did genetic testing for Bea Laura syndrome  Explain to patient that polyp not pre-cancerous but that I am trying to understand Bea Laura syndrome implications for next colonoscopy time   [facility] no letter or recall yet

## 2018-02-20 NOTE — Progress Notes (Signed)
I have reviewed derm records that Dr. Sandi Mariscal had  I want Ronald Edwards to see the genetics counselor at cancer center re: ? Muir-Torre syndrome  I do not think [name] had any blood drawn at derm (for genetic testing) but confirm with him but still have him see genetics

## 2018-02-21 ENCOUNTER — Other Ambulatory Visit: Payer: Self-pay | Admitting: Internal Medicine

## 2018-02-21 ENCOUNTER — Telehealth: Payer: Self-pay | Admitting: Internal Medicine

## 2018-02-21 DIAGNOSIS — D485 Neoplasm of uncertain behavior of skin: Secondary | ICD-10-CM

## 2018-02-21 NOTE — Telephone Encounter (Signed)
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

## 2018-02-21 NOTE — Telephone Encounter (Signed)
Pt returned your call from yesterday and would like a call back.

## 2018-02-27 ENCOUNTER — Telehealth: Payer: Self-pay | Admitting: Internal Medicine

## 2018-02-27 NOTE — Telephone Encounter (Signed)
Spoke with Seth Bake at the hospital and she said she will give him a call and set this up.

## 2018-02-27 NOTE — Telephone Encounter (Signed)
I called Mr Salm and left him a detailed message that they should be reaching out to him to set up this appointment.

## 2018-03-01 ENCOUNTER — Encounter: Payer: Self-pay | Admitting: Genetic Counselor

## 2018-03-22 ENCOUNTER — Encounter: Payer: Self-pay | Admitting: Genetic Counselor

## 2018-03-22 ENCOUNTER — Inpatient Hospital Stay: Payer: Medicare HMO | Attending: Genetic Counselor | Admitting: Genetic Counselor

## 2018-03-22 ENCOUNTER — Inpatient Hospital Stay: Payer: Medicare HMO

## 2018-03-22 DIAGNOSIS — C4499 Other specified malignant neoplasm of skin, unspecified: Secondary | ICD-10-CM | POA: Diagnosis not present

## 2018-03-22 DIAGNOSIS — D239 Other benign neoplasm of skin, unspecified: Secondary | ICD-10-CM

## 2018-03-22 DIAGNOSIS — Z86010 Personal history of colonic polyps: Secondary | ICD-10-CM

## 2018-03-22 DIAGNOSIS — D485 Neoplasm of uncertain behavior of skin: Secondary | ICD-10-CM

## 2018-03-22 DIAGNOSIS — C61 Malignant neoplasm of prostate: Secondary | ICD-10-CM | POA: Diagnosis not present

## 2018-03-22 DIAGNOSIS — Z800 Family history of malignant neoplasm of digestive organs: Secondary | ICD-10-CM

## 2018-03-22 DIAGNOSIS — Z7183 Encounter for nonprocreative genetic counseling: Secondary | ICD-10-CM

## 2018-03-23 ENCOUNTER — Encounter: Payer: Self-pay | Admitting: Genetic Counselor

## 2018-03-23 DIAGNOSIS — Z800 Family history of malignant neoplasm of digestive organs: Secondary | ICD-10-CM | POA: Insufficient documentation

## 2018-03-23 NOTE — Progress Notes (Signed)
REFERRING PROVIDER: Gatha Mayer, MD [address], [place] [address]  PRIMARY PROVIDER:  Derinda Late, MD   Janan Ridge, MD  PRIMARY REASON FOR VISIT:  1. Sebaceous adenoma   2. Family history of stomach cancer   3. Personal history of colonic polyps   4. Muir-Torre syndrome   5. Adenocarcinoma of prostate ([place])      HISTORY OF PRESENT ILLNESS:   Mr. Sperbeck, a 72 y.o. male, was seen for a [facility] cancer genetics consultation at the request of Dr. Carlean Purl due to a personal and family history of cancer.  Mr. Biller presents to clinic today to discuss the possibility of a hereditary predisposition to cancer, genetic testing, and to further clarify his future cancer risks, as well as potential cancer risks for family members.   In 2009, at the age of 24, Mr. Check was diagnosed with prostate cancer. He has a Gleason score of 6. Patient is seen at the  [facility] by Dr. Janan Ridge.  In February, Mr. Matuszak had a skin shaving that found a sebaceous carcinoma on his upper back.  At that time he was given the diagnosis of Muir-Torre syndrome and referred to Dr. Carlean Purl at [facility] for colonoscopy.  Dr. Carlean Purl referred the patient to genetics for additional follow up for Lynch syndrome/Muir-Torre syndrome.    CANCER HISTORY:   No history exists.    RISK FACTORS:  Prostate: Prostate cancer diagnosis around 2009. Gleason Score - 6 Dermatology: Sebaceous Carcinoma in 2019 Colonoscopy: yes; Has had 5 or more polyps, one was a TA, others were hyperplastic. Any excessive radiation exposure in the past:  no  Past Medical History:  Diagnosis Date  . Actinic keratosis   . Allergic rhinitis   . Depression   . Diverticulitis   . Diverticulitis of colon (without mention of hemorrhage)(562.11) 01/06/2011  . Diverticulosis   . DJD (degenerative joint disease) of lumbar spine   . Dyslipidemia   . Family history of stomach cancer   . Fecal incontinence   .  Gait disturbance   . Headache(784.0)   . Hearing loss   . Hepatitis    B  . History of colonic polyps   . Hyperlipemia   . Internal hemorrhoids   . Low back pain   . Malaise and fatigue   . Muir-Torre syndrome   . Obesity   . Osteoarthritis   . Perianal dermatitis   . Peripheral neuropathy   . Pneumonia   . Polyneuropathy in other diseases classified elsewhere ([place]) 11/21/2012  . Prostate cancer ([place])   . 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 Sebaceous adenoma   . Skin cancer   . Skin lesion    atypical, left lower leg    Past Surgical History:  Procedure Laterality Date  . ANKLE SURGERY Right    Charcot joint right ankle requiring surgery  pt. denies  . COLONOSCOPY    . COLONOSCOPY W/ POLYPECTOMY  2002; 08/01/2007; 02/09/11   2002: diminutive adenoma(s); 2008: internal hemorrhoids 2012: hyperplastic rectal polyp, left-sided diverticulosis  . HERNIA REPAIR  1982   left  . NASAL SEPTOPLASTY W/ TURBINOPLASTY    . PROSTATE BIOPSY    . SQUAMOUS CELL CARCINOMA EXCISION     Skin cancer  . TONSILLECTOMY    . UMBILICAL HERNIA REPAIR  1988    Social History   Socioeconomic History  . Marital status: Married    Spouse name:  Not on file  . Number of children: 3  . Years of education: Not on file  . Highest education level: Not on file  Occupational History  . Occupation: Financial controller used Production designer, theatre/television/film  Social Needs  . Financial resource strain: Not on file  . Food insecurity:    Worry: Not on file    Inability: Not on file  . Transportation needs:    Medical: Not on file    Non-medical: Not on file  Tobacco Use  . Smoking status: Never Smoker  . Smokeless tobacco: Never Used  Substance and Sexual Activity  . Alcohol use: Not Currently    Comment: infrequently  . Drug use: No  . Sexual activity: Not on file  Lifestyle  . Physical activity:    Days per week: Not on file    Minutes per session: Not on file  . Stress: Not on file    Relationships  . Social connections:    Talks on phone: Not on file    Gets together: Not on file    Attends religious service: Not on file    Active member of club or organization: Not on file    Attends meetings of clubs or organizations: Not on file    Relationship status: Not on file  Other Topics Concern  . Not on file  Social History Narrative  . Not on file     FAMILY HISTORY:  We obtained a detailed, 4-generation family history.  Significant diagnoses are listed below: Family History  Problem Relation Age of Onset  . Coronary artery disease Mother   . COPD Mother   . Diabetes Mother   . Stomach cancer Paternal Grandmother   . Lung cancer Father        Had a lung removed, smoker  . Pneumonia Father 16       died with this  . Other Father        skin moles removed, unsure of pathology  . Anxiety disorder Sister        also degenerative spine disease  . Allergic rhinitis Daughter        also son x2, all have spine disorders also  . Clotting disorder Son   . Heart failure Maternal Grandmother   . Stroke Maternal Grandfather   . Stroke Paternal Grandfather   . Heart attack Paternal Grandfather   . Colon cancer Neg Hx   . Colon polyps Neg Hx   . Esophageal cancer Neg Hx   . Rectal cancer Neg Hx     The patient has two sons and a daughter.  One son has a clotting disorder.  He has one sister who is cancer free.  Both parents are deceased.  His father had lung cancer and had multiple unusual skin moles.  His mother died at 30 from heart failure.  The patient's mother had one brother who is deceased.  He had a daughter who had an unknown cancer.  The maternal grandparents died of non cancer related issues.  The patients father had three sisters and two brothers who did not have cancer.  His grandmother died of stomach cancer and grandfather from a heart attack.  His grandmother reportedly had a family history of cancer.  Mr. Clemence is unaware of previous family  history of genetic testing for hereditary cancer risks. Patient's ancestors are of Caucasian NOS descent. There is no reported Ashkenazi Jewish ancestry. There is no known consanguinity.  GENETIC COUNSELING ASSESSMENT: DENNIS HEGEMAN is  a 72 y.o. male with a personal history of prostate cancer and sebaceous skin lesions which is somewhat suggestive of a Lynch syndrome diagnosis and predisposition to cancer. We, therefore, discussed and recommended the following at today's visit.   DISCUSSION: Mr. Mckelvie received a diagnosis of Bea Laura syndrome.  Muir-Torre syndrome is a historical term used to describe individuals presenting with a combination of sebaceous neoplasms of the skin and one or more internal cancers, commonly those seen in Lynch syndrome.  Mr. Dimmick has a diagnosis of Prostate cancer at age 59 and had a sebaceous carcinoma that was removed from his back in February 2019.   We discussed that sebaceous carcinomas are rare cancers that begin in an oil or sweat gland. While these types of cancers can occur sporadically, they can also be associated with Lynch syndrome.  According to [facility], about 2% (64 out of 3299) of patients with sebaceous skin lesions between 2000-2012 had Muir-Torre syndrome (Lynch syndrome) (PMID [id number]). Mr. Mcdanel does not have a family history that is consistent with Lynch syndrome, however, the diagnosis of two cancers, one of which is a rare cancer, suggests that further evaluation is warranted.  We reviewed the characteristics, features and inheritance patterns of hereditary cancer syndromes. We also discussed genetic testing, including the appropriate family members to test, the process of testing, insurance coverage and turn-around-time for results. We discussed the implications of a negative, positive and/or variant of uncertain significant result. We recommended Mr. Martel pursue genetic testing for the common hereditary gene panel. The Hereditary Gene  Panel offered by [facility] includes sequencing and/or deletion duplication testing of the following 47 genes: APC, ATM, AXIN2, BARD1, BMPR1A, BRCA1, BRCA2, BRIP1, CDH1, CDK4, CDKN2A (p14ARF), CDKN2A (p16INK4a), CHEK2, CTNNA1, DICER1, EPCAM (Deletion/duplication testing only), GREM1 (promoter region deletion/duplication testing only), KIT, MEN1, MLH1, MSH2, MSH3, MSH6, MUTYH, NBN, NF1, NHTL1, PALB2, PDGFRA, PMS2, POLD1, POLE, PTEN, RAD50, RAD51C, RAD51D, SDHB, SDHC, SDHD, SMAD4, SMARCA4. STK11, TP53, TSC1, TSC2, and VHL.  The following genes were evaluated for sequence changes only: SDHA and HOXB13 c.251G>A variant only.   PLAN: After considering the risks, benefits, and limitations, Mr. Meggett  provided informed consent to pursue genetic testing and the blood sample was sent to [facility] for analysis of the Common hereditary cancer panel. Results should be available within approximately 2-3 weeks' time, at which point they will be disclosed by telephone to Mr. Bordon, as will any additional recommendations warranted by these results. Mr. Silas will receive a summary of his genetic counseling visit and a copy of his results once available. This information will also be available in Epic. We encouraged Mr. Harmon to remain in contact with cancer genetics annually so that we can continuously update the family history and inform him of any changes in cancer genetics and testing that may be of benefit for his family. Mr. Dull questions were answered to his satisfaction today. Our contact information was provided should additional questions or concerns arise.  Lastly, we encouraged Mr. Plata to remain in contact with cancer genetics annually so that we can continuously update the family history and inform him of any changes in cancer genetics and testing that may be of benefit for this family.   Mr.  Evetts questions were answered to his satisfaction today. Our contact information was  provided should additional questions or concerns arise. Thank you for the referral and allowing [place] to share in the care of your patient.   Carnell Beavers P. Florene Glen, MS, [place]  Certified [employer] [place] [email or web address] phone: [phone]  The patient was seen for a total of 90 minutes in face-to-face genetic counseling.  This patient was discussed with Drs. Magrinat, Lindi Adie and/or Burr Medico who agrees with the above.    _______________________________________________________________________ For Office Staff:  Number of people involved in session: 1 Was an Intern/ student involved with case: no

## 2018-04-07 ENCOUNTER — Telehealth: Payer: Self-pay | Admitting: Genetic Counselor

## 2018-04-07 ENCOUNTER — Encounter: Payer: Self-pay | Admitting: Genetic Counselor

## 2018-04-07 DIAGNOSIS — Z1379 Encounter for other screening for genetic and chromosomal anomalies: Secondary | ICD-10-CM | POA: Insufficient documentation

## 2018-04-07 NOTE — Telephone Encounter (Signed)
Revelaed that he tested postiive for Lynch syndrome.  Discussed that his medical management will change primarily for colonoscopy.  PMS2 mutations do not pose as much risk for other Lynch syndrome cancers as some of the other genes.  Explained that his siblings and children are at 50% risk.  Their blood can be tested for free through [facility] for 90 days.  He will let his relatives know about this and have them call for an appointment.

## 2018-04-07 NOTE — Telephone Encounter (Signed)
LM on VM that results were back and to please call back. 

## 2018-04-10 ENCOUNTER — Encounter: Payer: Self-pay | Admitting: Genetic Counselor

## 2018-04-10 DIAGNOSIS — Z1509 Genetic susceptibility to other malignant neoplasm: Secondary | ICD-10-CM | POA: Insufficient documentation

## 2018-04-13 ENCOUNTER — Inpatient Hospital Stay: Payer: Medicare HMO | Attending: Genetic Counselor | Admitting: Genetic Counselor

## 2018-04-13 DIAGNOSIS — Z1509 Genetic susceptibility to other malignant neoplasm: Secondary | ICD-10-CM | POA: Diagnosis not present

## 2018-04-13 DIAGNOSIS — Z1379 Encounter for other screening for genetic and chromosomal anomalies: Secondary | ICD-10-CM

## 2018-04-14 ENCOUNTER — Encounter: Payer: Self-pay | Admitting: Genetic Counselor

## 2018-04-14 NOTE — Progress Notes (Addendum)
REFERRING PROVIDER: Gatha Mayer, MD [address], [place] [address]  PRIMARY PROVIDER:  Derinda Late, MD   Dutch Gray, MD  Janan Ridge, MD  PRIMARY REASON FOR VISIT:  1. Genetic testing   2. Lynch syndrome     GENETIC TEST RESULTS   Patient Name: Ronald Edwards Patient Age: 72 y.o. Encounter Date: 04/13/2018  HPI: Mr. Mano was previously seen in the [facility] due to a family of cancer and concerns regarding a hereditary predisposition to cancer. Please refer to our prior cancer genetics clinic note for more information regarding Mr. Luppino medical, social and family histories, and our assessment and recommendations, at the time. Mr. Magowan recent genetic test results were disclosed to him, as were recommendations warranted by these results. These results and recommendations are discussed in more detail below.   FAMILY HISTORY:  We obtained a detailed, 4-generation family history.  Significant diagnoses are listed below: Family History  Problem Relation Age of Onset  . Coronary artery disease Mother   . COPD Mother   . Diabetes Mother   . Stomach cancer Paternal Grandmother   . Lung cancer Father        Had a lung removed, smoker  . Pneumonia Father 31       died with this  . Other Father        skin moles removed, unsure of pathology  . Anxiety disorder Sister        also degenerative spine disease  . Allergic rhinitis Daughter        also son x2, all have spine disorders also  . Clotting disorder Son   . Heart failure Maternal Grandmother   . Uterine cancer Maternal Grandmother        dx in her 37s-80s  . Stroke Maternal Grandfather   . Stroke Paternal Grandfather   . Heart attack Paternal Grandfather   . Colon cancer Neg Hx   . Colon polyps Neg Hx   . Esophageal cancer Neg Hx   . Rectal cancer Neg Hx     This family history has an update with his maternal grandmother's cancer.  The patient has two sons  and a daughter.  One son has a clotting disorder.  He has one sister who is cancer free.  Both parents are deceased.  His father had lung cancer and had multiple unusual skin moles.  His mother died at 18 from heart failure.  The patient's mother had one brother who is deceased.  He had a daughter who had an unknown cancer.  The maternal grandparents died of non cancer related issues, however, the grandmother had uterine cancer in her late 110's to early 88's.  The patients father had three sisters and two brothers who did not have cancer.  His grandmother died of stomach cancer and grandfather from a heart attack.  His grandmother reportedly had a family history of cancer.  Mr. Hitchner is unaware of previous family history of genetic testing for hereditary cancer risks. Patient's ancestors are of Caucasian NOS descent. There is no reported Ashkenazi Jewish ancestry. There is no known consanguinity.  GENETIC TESTING:  At the time of Mr. Stoudt visit, we recommended he pursue genetic testing of the common hereditary cancer panel. 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 STK11, TP53, TSC1, TSC2, and VHL .  A copy of the test report has been scanned into Epic for review.   Discussion: Surveillance guidelines for Lynch syndrome are based off the latest NCCN guidelines (v.2.2019), and vary by gene.  PMS2 mutations convey a lower risk for cancer than some of the other genes.  The  lifetime risk for colon cancer in an individual with a PMS2 mutation is estimated between 12-20%, whereas the risk for uterine cancer in women with PMS2 mutations is estimated at up to 15%.  NCCN guidelines (v.2.2019) indicate that there is not enough evidence to make specific recommendations for a risk reducing salpingo-oophorectomy.  Based on these risks for cancer, increased surveillance is suggested.  SCREENING RECOMMENDATIONS: We discussed the implications of Lynch syndrome for Mr. Bozman, and discussed who else in the family should have genetic testing. We recommended Mr. Gatling follow management guidelines for Lynch syndrome; all of which are outlined below. These can be coordinated by Mr. Fix GI doctor or her primary provider.   1. Annual colonoscopy.   2. While there is no clear evidence to support screening for stomach and small bowel cancer, an upper endoscopy can be considered at 3-5 year intervals beginning at age 39-35. However, whether to have this screening is best determined by the gastroenterologist.   3. Annual urinalysis.   4. For patients with colorectal adenocarcinoma, segmental or extended colectomy may be considered.  For women with Lynch syndrome, unlike the effective surveillance plan for colorectal cancer risk, there is no professional agreement regarding management for the increased risk of uterine cancer. We are available to help women and their providers establish an individualized surveillance plan. It is also important for women to understand the following:   1. Women should seek medical attention if they experience abnormal vaginal bleeding.  2. Some providers may still recommend vaginal ultrasounds, uterine biopsies (for uterine cancer risk) and/or CA-125 analysis ( for ovarian cancer risk), even though these have not been shown to be effective.  3. A hysterectomy should be considered once childbearing is completed (if planned).  FAMILY MEMBERS: Since we  now know the mutation in Mr. Kraemer, we can test at-risk relatives to determine whether or not they have inherited the mutation and are at increased risk for cancer.  It is important that all of Mr. Noah relatives (both men and women) know of the presence of this gene mutation. Site-specific genetic testing can sort out who in the family is at risk and who is not. We will be happy to meet with any of the family members or refer them to a genetic counselor in their local area. To locate genetic counselors in other cities, individuals can visit the website of the [employer] (ArtistMovie.se) and Secretary/administrator for a Social worker by zip code.   Mr. Yeager children and sister have a 50% chance to have inherited this mutation. We recommend they have genetic testing for this same mutation, as identifying the presence of this mutation would allow them to also take advantage of risk-reducing measures. Mr. Selinda Flavin man sister attended today's visit and is going to pursue genetic testing.  Children who inherit two mutations in the same Lynch gene, one mutation from each  parent, are at-risk of a rare recessive condition called constitutional mismatch repair deficiency (CMMR-D) syndrome. If family members have this mutation, they may wish to have their partner tested if they are planning on having children.  PLAN: Mr. Granlund will need to be followed as high risk based on his diagnosis of Lynch syndrome.    Mr. Drudge has identified a gastroenterologist that he plans to see for follow up for his diagnosis of Lynch syndrome.  This individual is Dr. Carlean Purl.    RESOURCES:  Mr. Weathington was given patient information about Lynch syndrome that was written by the national support group "it takes guts".    We strongly encouraged Mr. Gleed to remain in contact with [place] in cancer genetics on an annual basis so we can update Mr. Drumwright personal and family histories, and inform him of advances in cancer  genetics that may be of benefit for the entire family. Mr. Kutzer knows he is also welcome to call with any questions or concerns, at any time.   Annalina Needles P. Florene Glen, [place], [facility]  Certified [employer]  [phone]

## 2018-04-26 ENCOUNTER — Encounter: Payer: Self-pay | Admitting: Internal Medicine

## 2018-04-26 ENCOUNTER — Telehealth: Payer: Self-pay | Admitting: Internal Medicine

## 2018-04-26 NOTE — Telephone Encounter (Signed)
He needs to set up an EGD dx Lynch syndrome  Colonoscopy recall should now be 1 year after last one

## 2018-04-26 NOTE — Telephone Encounter (Signed)
See results of genetic testing.  He would like to know what the plan is for colon screening and potential EGD.

## 2018-04-27 NOTE — Telephone Encounter (Signed)
Patient notified.  EGD and pre-visit scheduled.  Recall entered for 01/2019

## 2018-05-29 ENCOUNTER — Ambulatory Visit (AMBULATORY_SURGERY_CENTER): Payer: Self-pay | Admitting: *Deleted

## 2018-05-29 ENCOUNTER — Encounter: Payer: Self-pay | Admitting: Internal Medicine

## 2018-05-29 VITALS — Ht 72.0 in | Wt 273.0 lb

## 2018-05-29 DIAGNOSIS — Z1509 Genetic susceptibility to other malignant neoplasm: Secondary | ICD-10-CM

## 2018-05-29 NOTE — Progress Notes (Signed)
No egg or soy allergy known to patient  No issues with past sedation with any surgeries  or procedures, no intubation problems  No diet pills per patient No home 02 use per patient  No blood thinners per patient  Pt denies issues with constipation  No A fib or A flutter  EMMI video sent to pt's e mail pt declined   

## 2018-06-12 ENCOUNTER — Ambulatory Visit (AMBULATORY_SURGERY_CENTER): Payer: Medicare HMO | Admitting: Internal Medicine

## 2018-06-12 ENCOUNTER — Encounter: Payer: Self-pay | Admitting: Internal Medicine

## 2018-06-12 VITALS — BP 110/52 | HR 55 | Temp 97.3°F | Resp 10 | Ht 72.0 in | Wt 273.0 lb

## 2018-06-12 DIAGNOSIS — Z86010 Personal history of colonic polyps: Secondary | ICD-10-CM

## 2018-06-12 DIAGNOSIS — K317 Polyp of stomach and duodenum: Secondary | ICD-10-CM | POA: Diagnosis not present

## 2018-06-12 DIAGNOSIS — Z1509 Genetic susceptibility to other malignant neoplasm: Secondary | ICD-10-CM

## 2018-06-12 DIAGNOSIS — K635 Polyp of colon: Secondary | ICD-10-CM

## 2018-06-12 DIAGNOSIS — K2970 Gastritis, unspecified, without bleeding: Secondary | ICD-10-CM | POA: Diagnosis not present

## 2018-06-12 DIAGNOSIS — K2950 Unspecified chronic gastritis without bleeding: Secondary | ICD-10-CM

## 2018-06-12 MED ORDER — SODIUM CHLORIDE 0.9 % IV SOLN: 500.0000 mL | Freq: Once | INTRAVENOUS | Status: DC

## 2018-06-12 NOTE — Patient Instructions (Addendum)
There were/are some polyps in the stomach - they do NOt look like a problem. I removed one and took biopsies of others and will let you know results and plans.  I appreciate the opportunity to care for you. Gatha Mayer, MD, FACG     YOU HAD AN ENDOSCOPIC PROCEDURE TODAY AT [facility]:   Refer to the procedure report that was given to you for any specific questions about what was found during the examination.  If the procedure report does not answer your questions, please call your gastroenterologist to clarify.  If you requested that your care partner not be given the details of your procedure findings, then the procedure report has been included in a sealed envelope for you to review at your convenience later.  YOU SHOULD EXPECT: Some feelings of bloating in the abdomen. Passage of more gas than usual.  Walking can help get rid of the air that was put into your GI tract during the procedure and reduce the bloating. If you had a lower endoscopy (such as a colonoscopy or flexible sigmoidoscopy) you may notice spotting of blood in your stool or on the toilet paper. If you underwent a bowel prep for your procedure, you may not have a normal bowel movement for a few days.  Please Note:  You might notice some irritation and congestion in your nose or some drainage.  This is from the oxygen used during your procedure.  There is no need for concern and it should clear up in a day or so.  SYMPTOMS TO REPORT IMMEDIATELY:    Following upper endoscopy (EGD)  Vomiting of blood or coffee ground material  New chest pain or pain under the shoulder blades  Painful or persistently difficult swallowing  New shortness of breath  Fever of 100F or higher  Black, tarry-looking stools  For urgent or emergent issues, a gastroenterologist can be reached at any hour by calling [phone].   DIET:  We do recommend a small meal at first, but then you may proceed to your regular  diet.  Drink plenty of fluids but you should avoid alcoholic beverages for 24 hours.  ACTIVITY:  You should plan to take it easy for the rest of today and you should NOT DRIVE or use heavy machinery until tomorrow (because of the sedation medicines used during the test).    FOLLOW UP: Our staff will call the number listed on your records the next business day following your procedure to check on you and address any questions or concerns that you may have regarding the information given to you following your procedure. If we do not reach you, we will leave a message.  However, if you are feeling well and you are not experiencing any problems, there is no need to return our call.  We will assume that you have returned to your regular daily activities without incident.  If any biopsies were taken you will be contacted by phone or by letter within the next 1-3 weeks.  Please call us at [phone] if you have not heard about the biopsies in 3 weeks.    SIGNATURES/CONFIDENTIALITY: You and/or your care partner have signed paperwork which will be entered into your electronic medical record.  These signatures attest to the fact that that the information above on your After Visit Summary has been reviewed and is understood.  Full responsibility of the confidentiality of this discharge information lies with you and/or your care-partner.  Handout was given to your care partner on polyps. You may resume your current medications today. Await biopsy results. Please call if any questions or concerns.

## 2018-06-12 NOTE — Progress Notes (Signed)
Report to PACU, RN, vss, BBS= Clear.  

## 2018-06-12 NOTE — Op Note (Signed)
[facility] Patient Name: Ronald Edwards Procedure Date: 06/12/2018 3:36 PM MRN: [id number] Endoscopist: Gatha Mayer , MD Age: 72 Referring MD:  Date of Birth: Dec 23, 1945 Gender: Male Account #: [id number] Procedure:                Upper GI endoscopy Indications:              Hereditary nonpolyposis colorectal cancer (Lynch                            Syndrome) Medicines:                Propofol per Anesthesia, Monitored Anesthesia Care Procedure:                Pre-Anesthesia Assessment:                           - Prior to the procedure, a History and Physical                            was performed, and patient medications and                            allergies were reviewed. The patient's tolerance of                            previous anesthesia was also reviewed. The risks                            and benefits of the procedure and the sedation                            options and risks were discussed with the patient.                            All questions were answered, and informed consent                            was obtained. Prior Anticoagulants: The patient has                            taken no previous anticoagulant or antiplatelet                            agents. ASA Grade Assessment: II - A patient with                            mild systemic disease. After reviewing the risks                            and benefits, the patient was deemed in                            satisfactory condition to undergo the procedure.  After obtaining informed consent, the endoscope was                            passed under direct vision. Throughout the                            procedure, the patient's blood pressure, pulse, and                            oxygen saturations were monitored continuously. The                            Endoscope was introduced through the mouth, and                            advanced to the second  part of duodenum. The upper                            GI endoscopy was accomplished without difficulty.                            The patient tolerated the procedure well. Scope In: Scope Out: Findings:                 A single 4 mm sessile polyp with no stigmata of                            recent bleeding was found in the gastric antrum.                            The polyp was removed with a cold snare. Resection                            and retrieval were complete. Verification of                            patient identification for the specimen was done.                            Estimated blood loss was minimal.                           Multiple diminutive semi-sessile polyps were found                            in the gastric fundus and in the gastric body.                            Biopsies were taken with a cold forceps for                            histology. Verification of patient identification  for the specimen was done. Estimated blood loss was                            minimal.                           The exam was otherwise without abnormality.                           The cardia and gastric fundus were normal on                            retroflexion. Complications:            No immediate complications. Estimated Blood Loss:     Estimated blood loss was minimal. Impression:               - A single gastric polyp. Resected and retrieved.                           - Multiple proximal gastric polyps. Biopsied. These                            look like benign fundic gland polyps                           - The examination was otherwise normal. Recommendation:           - Patient has a contact number available for                            emergencies. The signs and symptoms of potential                            delayed complications were discussed with the                            patient. Return to normal activities tomorrow.                             Written discharge instructions were provided to the                            patient.                           - Resume previous diet.                           - Continue present medications.                           - Await pathology results. Gatha Mayer, MD 06/12/2018 3:57:47 PM This report has been signed electronically.

## 2018-06-12 NOTE — Progress Notes (Signed)
Called to room to assist during endoscopic procedure.  Patient ID and intended procedure confirmed with present staff. Received instructions for my participation in the procedure from the performing physician.  

## 2018-06-12 NOTE — Progress Notes (Signed)
No problems noted in the recovery room. maw 

## 2018-06-13 ENCOUNTER — Telehealth: Payer: Self-pay | Admitting: *Deleted

## 2018-06-13 ENCOUNTER — Ambulatory Visit: Payer: Medicare HMO | Admitting: Internal Medicine

## 2018-06-13 NOTE — Telephone Encounter (Signed)
  Follow up Call-  Call back number 06/12/2018 01/30/2018  Post procedure Call Back phone  # [phone]  ( call after 10AM due to wife/dementia [phone]  Permission to leave phone message Yes Yes  Some recent data might be hidden     No answer at # given.  LM on VM.

## 2018-06-13 NOTE — Telephone Encounter (Signed)
  Follow up Call-  Call back number 06/12/2018 01/30/2018  Post procedure Call Back phone  # [phone]  ( call after 10AM due to wife/dementia [phone]  Permission to leave phone message Yes Yes  Some recent data might be hidden     Patient questions:  Message left to all [place] if necessary.  Second call.

## 2018-06-21 ENCOUNTER — Encounter: Payer: Self-pay | Admitting: Internal Medicine

## 2018-06-21 NOTE — Progress Notes (Signed)
Benign polyps Recall egd 2 years Donnal Debar)

## 2018-06-28 ENCOUNTER — Telehealth: Payer: Self-pay | Admitting: Internal Medicine

## 2018-06-28 NOTE — Telephone Encounter (Signed)
Pt has questions regarding result letter that he received today re: biopsy results

## 2018-06-28 NOTE — Telephone Encounter (Signed)
All questions answered about pathology results answered.  He will call back for any additional questions or concerns.

## 2019-01-18 ENCOUNTER — Telehealth: Payer: Self-pay | Admitting: Internal Medicine

## 2019-01-18 NOTE — Telephone Encounter (Signed)
No answer/machine.  I will try and call back at a later date.

## 2019-01-18 NOTE — Telephone Encounter (Signed)
See questions about recall

## 2019-01-18 NOTE — Telephone Encounter (Signed)
Pt returned pur call about scheduling recall colon. Pt would like Dr. Celesta Aver guidance about putting of procedure due to Covid-19, he states that his wife has alzheimer so at this moment he is the only one taking care of her. He is due for an Egd in Oct 2021. Would it be possible to postpone it until then?

## 2019-01-18 NOTE — Telephone Encounter (Signed)
It goes against the guidelines to wait that long for the colonoscopy. I know its a tough thing to sort out. I suggest we place a September recall and revisit then.

## 2019-01-19 ENCOUNTER — Encounter: Payer: Self-pay | Admitting: Internal Medicine

## 2019-01-23 NOTE — Telephone Encounter (Signed)
Patient notified of recommendations New recall placed for September He is being treated now for prostate cancer and is the sole caregiver for his wife.  He agrees to plan.

## 2019-01-23 NOTE — Telephone Encounter (Signed)
Left message for patient to call back  

## 2019-03-27 ENCOUNTER — Other Ambulatory Visit: Payer: Self-pay | Admitting: *Deleted

## 2019-03-27 ENCOUNTER — Telehealth: Payer: Self-pay | Admitting: *Deleted

## 2019-03-27 DIAGNOSIS — R55 Syncope and collapse: Secondary | ICD-10-CM

## 2019-03-27 NOTE — Telephone Encounter (Signed)
Follow up ° ° °Patient is returning your call. Please call. ° ° ° °

## 2019-03-27 NOTE — Telephone Encounter (Signed)
3 day ZIO XT long term holter monitor to be mailed to patients home, priority mail [facility].  Instructions reviewed briefly as they are included in the monitor kit.  Patietn aware he needs to wear the monitor at least 48 hours but up to 72 hours.

## 2019-03-27 NOTE — Telephone Encounter (Signed)
Attempt to contact patient to set up holter monitor.  No DPR

## 2019-04-29 ENCOUNTER — Ambulatory Visit (INDEPENDENT_AMBULATORY_CARE_PROVIDER_SITE_OTHER): Payer: Medicare Other

## 2019-04-29 DIAGNOSIS — R55 Syncope and collapse: Secondary | ICD-10-CM | POA: Diagnosis not present

## 2019-06-04 ENCOUNTER — Ambulatory Visit: Payer: Medicare Other | Admitting: Cardiology

## 2019-06-04 ENCOUNTER — Encounter: Payer: Self-pay | Admitting: Cardiology

## 2019-06-04 ENCOUNTER — Other Ambulatory Visit: Payer: Self-pay

## 2019-06-04 VITALS — BP 128/78 | HR 62 | Ht 72.0 in | Wt 261.0 lb

## 2019-06-04 DIAGNOSIS — R55 Syncope and collapse: Secondary | ICD-10-CM | POA: Diagnosis not present

## 2019-06-04 NOTE — Patient Instructions (Signed)
Medication Instructions:  Your physician recommends that you continue on your current medications as directed. Please refer to the Current Medication list given to you today.  * If you need a refill on your cardiac medications before your next appointment, please call your pharmacy.   Labwork: None ordered  Testing/Procedures: None ordered  Follow-Up: No follow up is needed at this time with Dr. Curt Bears.  He will see you on an as needed basis.  Thank you for choosing [facility]!!

## 2019-06-04 NOTE — Progress Notes (Signed)
Electrophysiology Office Note   Date:  06/04/2019   ID:  Ronald Edwards, Ronald Edwards November 01, 1945, MRN [id number]  PCP:  Derinda Late, MD  Cardiologist:   Primary Electrophysiologist:  Penne Rosenstock Meredith Leeds, MD    Chief Complaint: Syncope   History of Present Illness: Ronald Edwards is a 73 y.o. male who is being seen today for the evaluation of syncope at the request of Derinda Late, MD. Presenting today for electrophysiology evaluation.  He has a history of recurrent syncopal episodes.  His most recent episode occurred on 03/11/2019 when he was standing outside of his church.  It was hot and he had not eaten yet.  He felt lightheaded for approximately 2 minutes and had a syncopal episode.  He estimates that he was unconscious for approximately 5 minutes.  He did not have palpitations or chest pain at that time.  He has had similar episodes which occurred a month prior when he was at a funeral.  It was hot at that time as well.  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  Today, he denies symptoms of palpitations, chest pain, shortness of breath, orthopnea, PND, lower extremity edema, claudication, dizziness, presyncope, syncope, bleeding, or neurologic sequela. The patient is tolerating medications without difficulties.    Past Medical History:  Diagnosis Date  . Actinic keratosis   . Allergic rhinitis   . Allergy   . Depression   . Diverticulitis   . Diverticulitis of colon (without mention of hemorrhage)(562.11) 01/06/2011  . Diverticulosis   . DJD (degenerative joint disease) of lumbar spine   . Dyslipidemia   . Family history of stomach cancer   . Fecal incontinence   . Gait disturbance   . Headache(784.0)   . Hearing loss   . Hepatitis    B  . History of colonic polyps   . Hyperlipemia   . Internal hemorrhoids   . Low back pain   . Lynch syndrome   . Malaise and fatigue   . Muir-Torre syndrome   . Obesity    . Osteoarthritis   . Perianal dermatitis   . Peripheral neuropathy   . Pneumonia   . Polyneuropathy in other diseases classified elsewhere ([place]) 11/21/2012  . Prostate cancer ([place])   . 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 Sebaceous adenoma   . Skin cancer   . Skin lesion    atypical, left lower leg   Past Surgical History:  Procedure Laterality Date  . ANKLE SURGERY Right    Charcot joint right ankle requiring surgery  pt. denies  . COLONOSCOPY    . COLONOSCOPY W/ POLYPECTOMY  2002; 08/01/2007; 02/09/11   2002: diminutive adenoma(s); 2008: internal hemorrhoids 2012: hyperplastic rectal polyp, left-sided diverticulosis  . HERNIA REPAIR  1982   left  . NASAL SEPTOPLASTY W/ TURBINOPLASTY    . PROSTATE BIOPSY    . SQUAMOUS CELL CARCINOMA EXCISION     Skin cancer  . TONSILLECTOMY    . UMBILICAL HERNIA REPAIR  1988     Current Outpatient Medications  Medication Sig Dispense Refill  . finasteride (PROSCAR) 5 MG tablet Take 1 tablet by mouth daily.    . fluticasone (FLONASE) 50 MCG/ACT nasal spray Place 1 spray into both nostrils daily as needed.    Marland Kitchen ibuprofen (ADVIL,MOTRIN) 200 MG tablet Take 200 mg by mouth. As needed     .  metoprolol succinate (TOPROL-XL) 25 MG 24 hr tablet Take 25 mg by mouth daily.      . pravastatin (PRAVACHOL) 10 MG tablet Take 1 tablet by mouth daily.    . Psyllium Husk POWD Mix 1 tbsp in water every night and drink  0  . tamsulosin (FLOMAX) 0.4 MG CAPS capsule Take 1 capsule by mouth daily.     No current facility-administered medications for this visit.     Allergies:   Lovastatin, Ondansetron hcl, Penicillins, Pravastatin, Simvastatin, and Sulfa antibiotics   Social History:  The patient  reports that he has never smoked. He has never used smokeless tobacco. He reports previous alcohol use. He reports that he does not use drugs.   Family History:  The patient's family history includes Allergic rhinitis in his daughter;  Anxiety disorder in his sister; COPD in his mother; Clotting disorder in his son; Coronary artery disease in his mother; Diabetes in his mother; Heart attack in his paternal grandfather; Heart failure in his maternal grandmother; Lung cancer in his father; Other in his father; Pneumonia (age of onset: 62) in his father; Stomach cancer in his paternal grandmother; Stroke in his maternal grandfather and paternal grandfather; Uterine cancer in his maternal grandmother.    ROS:  Please see the history of present illness.   Otherwise, review of systems is positive for none.   All other systems are reviewed and negative.    PHYSICAL EXAM: VS:  There were no vitals taken for this visit. , BMI There is no height or weight on file to calculate BMI. GEN: Well nourished, well developed, in no acute distress  HEENT: normal  Neck: no JVD, carotid bruits, or masses Cardiac: RRR; no murmurs, rubs, or gallops,no edema  Respiratory:  clear to auscultation bilaterally, normal work of breathing GI: soft, nontender, nondistended, + BS MS: no deformity or atrophy  Skin: warm and dry Neuro:  Strength and sensation are intact Psych: euthymic mood, full affect  EKG:  EKG is ordered today. Personal review of the ekg ordered shows sinus rhythm, rate 62  Recent Labs: No results found for requested labs within last 8760 hours.    Lipid Panel  No results found for: CHOL, TRIG, HDL, CHOLHDL, VLDL, LDLCALC, LDLDIRECT   Wt Readings from Last 3 Encounters:  06/12/18 273 lb (123.8 kg)  05/29/18 273 lb (123.8 kg)  01/30/18 274 lb (124.3 kg)      Other studies Reviewed: Additional studies/ records that were reviewed today include: Zio 05/10/19 personally reviewed  Review of the above records today demonstrates:  Sinus rhythm with sinus bradycardia. Slowest heart rate 37 beats per minutes at 3:10am One 6 beat run of non-sustained ventricular tachycardia Several runs of supraventricular tachycardia (longest 7  beats).    ASSESSMENT AND PLAN:  1.  Syncope: Had 2 episodes of syncope.  Both of his episodes occurred during the same circumstances.  He had not eaten or had anything to drink, was standing outside midday wearing a mask and church clothes, and had his episodes.  At this point, he has not had palpitations.  I do not feel that his episodes of syncope were arrhythmia genic.  He also wore a cardiac monitor that showed no major abnormalities.  He did have some nocturnal bradycardia, though this is not portend a poor prognosis.  Should he have further episodes of syncope, would plan for further cardiac monitoring.    Current medicines are reviewed at length with the patient today.   The  patient does not have concerns regarding his medicines.  The following changes were made today:  none  Labs/ tests ordered today include:  No orders of the defined types were placed in this encounter.    Disposition:   FU with Lavaun [place] PRN  Signed, Zaleah Ternes Meredith Leeds, MD  06/04/2019 9:24 AM     [place] [address] [phone] (office) [phone] (fax)

## 2019-06-15 ENCOUNTER — Encounter: Payer: Self-pay | Admitting: Internal Medicine

## 2019-06-22 ENCOUNTER — Telehealth: Payer: Self-pay | Admitting: Internal Medicine

## 2019-06-22 NOTE — Telephone Encounter (Signed)
Patient notified not urgent, but call back when he is ready

## 2019-08-01 ENCOUNTER — Telehealth: Payer: Self-pay | Admitting: Internal Medicine

## 2019-08-01 NOTE — Telephone Encounter (Signed)
Pt stated that he is aware that he is overdue for a recall colon due to Lynch syndrome and a hx of polyps.  He informed that he is unable to schedule until spring 2021 due to wife's health issues.  Pt has concerns regarding postponing colon and would like to discuss.

## 2019-08-01 NOTE — Telephone Encounter (Signed)
Is it okay for him to wait until spring for recall colonoscopy?

## 2019-08-01 NOTE — Telephone Encounter (Signed)
Patient notified

## 2019-08-01 NOTE — Telephone Encounter (Signed)
That is his call Ideally better to do now if he can get some help.  Odds are he will be ok but cannot guarantee

## 2019-08-31 HISTORY — PX: COLONOSCOPY: SHX174

## 2019-09-30 ENCOUNTER — Ambulatory Visit: Payer: Medicare Other

## 2019-10-05 ENCOUNTER — Ambulatory Visit: Payer: Medicare Other

## 2019-10-05 ENCOUNTER — Ambulatory Visit: Payer: Medicare Other | Attending: Internal Medicine

## 2019-10-05 DIAGNOSIS — Z23 Encounter for immunization: Secondary | ICD-10-CM | POA: Insufficient documentation

## 2019-10-05 NOTE — Progress Notes (Signed)
   Covid-19 Vaccination Clinic  Name:  Ronald Edwards    MRN: [id number] DOB: 06-30-1946  10/05/2019  [name] was observed post Covid-19 immunization for 15 minutes without incidence. He was provided with Vaccine Information Sheet and instruction to access the V-Safe system.   [name] was instructed to call 911 with any severe reactions post vaccine: Marland Kitchen Difficulty breathing  . Swelling of your face and throat  . A fast heartbeat  . A bad rash all over your body  . Dizziness and weakness    Immunizations Administered    Name Date Dose VIS Date Route   Pfizer COVID-19 Vaccine 10/05/2019 11:28 AM 0.3 mL 08/10/2019 Intramuscular   Manufacturer: [place]   Lot: [id number]   [place]: [id number]

## 2019-10-31 ENCOUNTER — Ambulatory Visit: Payer: Medicare Other | Attending: Internal Medicine

## 2019-10-31 DIAGNOSIS — Z23 Encounter for immunization: Secondary | ICD-10-CM | POA: Insufficient documentation

## 2019-10-31 NOTE — Progress Notes (Signed)
   Covid-19 Vaccination Clinic  Name:  Ronald Edwards    MRN: [id number] DOB: [birth year]  10/31/2019  Ronald Edwards was observed post Covid-19 immunization for 15 minutes without incident. He was provided with Vaccine Information Sheet and instruction to access the V-Safe system.   Ronald Edwards was instructed to call 911 with any severe reactions post vaccine: Marland Kitchen Difficulty breathing  . Swelling of face and throat  . A fast heartbeat  . A bad rash all over body  . Dizziness and weakness   Immunizations Administered    Name Date Dose VIS Date Route   Pfizer COVID-19 Vaccine 10/31/2019  3:26 PM 0.3 mL 08/10/2019 Intramuscular   Manufacturer: [place]   Lot: [id number]   [place]: [id number]

## 2020-01-09 ENCOUNTER — Ambulatory Visit
Admission: RE | Admit: 2020-01-09 | Discharge: 2020-01-09 | Disposition: A | Discharge status: Home / Self Care | Payer: Medicare Other | Source: Ambulatory Visit | Attending: Family Medicine | Admitting: Family Medicine

## 2020-01-09 ENCOUNTER — Other Ambulatory Visit: Payer: Self-pay

## 2020-01-09 ENCOUNTER — Other Ambulatory Visit: Payer: Self-pay | Admitting: Family Medicine

## 2020-01-09 DIAGNOSIS — M25552 Pain in left hip: Secondary | ICD-10-CM

## 2020-01-09 DIAGNOSIS — M25551 Pain in right hip: Secondary | ICD-10-CM

## 2020-01-30 ENCOUNTER — Other Ambulatory Visit: Payer: Self-pay | Admitting: Family Medicine

## 2020-01-30 DIAGNOSIS — M25552 Pain in left hip: Secondary | ICD-10-CM

## 2020-03-04 ENCOUNTER — Ambulatory Visit
Admission: RE | Admit: 2020-03-04 | Discharge: 2020-03-04 | Disposition: A | Discharge status: Home / Self Care | Payer: Medicare Other | Source: Ambulatory Visit | Attending: Family Medicine | Admitting: Family Medicine

## 2020-03-04 DIAGNOSIS — M25551 Pain in right hip: Secondary | ICD-10-CM

## 2020-03-04 DIAGNOSIS — M25552 Pain in left hip: Secondary | ICD-10-CM

## 2020-03-19 ENCOUNTER — Telehealth: Payer: Self-pay | Admitting: Physical Medicine and Rehabilitation

## 2020-03-19 NOTE — Telephone Encounter (Signed)
Called patient for a third time to schedule bilateral hip injections (paper referral from Dr. Sandi Mariscal). No answer and no voicemail.

## 2020-03-19 NOTE — Telephone Encounter (Signed)
Pt would like a CB in regards to a referral.  [phone]

## 2020-03-25 NOTE — Telephone Encounter (Signed)
Patient has not returned call to schedule. Closing encounter.

## 2020-05-09 ENCOUNTER — Encounter: Payer: Self-pay | Admitting: Internal Medicine

## 2020-06-23 ENCOUNTER — Ambulatory Visit (AMBULATORY_SURGERY_CENTER): Payer: Self-pay | Admitting: *Deleted

## 2020-06-23 ENCOUNTER — Other Ambulatory Visit: Payer: Self-pay

## 2020-06-23 ENCOUNTER — Encounter: Payer: Self-pay | Admitting: Internal Medicine

## 2020-06-23 VITALS — Ht 72.0 in | Wt 263.0 lb

## 2020-06-23 DIAGNOSIS — Z1509 Genetic susceptibility to other malignant neoplasm: Secondary | ICD-10-CM

## 2020-06-23 NOTE — Progress Notes (Signed)

## 2020-07-04 ENCOUNTER — Telehealth: Payer: Self-pay

## 2020-07-04 NOTE — Telephone Encounter (Signed)
Left VM to remind patient of procedure on Monday with Dr. Carlean Purl at [facility].

## 2020-07-07 ENCOUNTER — Ambulatory Visit (AMBULATORY_SURGERY_CENTER): Payer: Medicare Other | Admitting: Internal Medicine

## 2020-07-07 ENCOUNTER — Other Ambulatory Visit: Payer: Self-pay

## 2020-07-07 ENCOUNTER — Encounter: Payer: Self-pay | Admitting: Internal Medicine

## 2020-07-07 VITALS — BP 110/68 | HR 47 | Temp 97.5°F | Resp 13 | Ht 72.0 in | Wt 263.0 lb

## 2020-07-07 DIAGNOSIS — K317 Polyp of stomach and duodenum: Secondary | ICD-10-CM

## 2020-07-07 DIAGNOSIS — Z1509 Genetic susceptibility to other malignant neoplasm: Secondary | ICD-10-CM

## 2020-07-07 MED ORDER — SODIUM CHLORIDE 0.9 % IV SOLN
500.0000 mL | Freq: Once | INTRAVENOUS | Status: DC
Start: 2020-07-07 — End: 2021-07-06

## 2020-07-07 NOTE — Progress Notes (Signed)
A/ox3, pleased with MAC, report to RN 

## 2020-07-07 NOTE — Op Note (Signed)
[place] Patient Name: Ronald Edwards Procedure Date: 07/07/2020 9:02 AM MRN: [id number] Endoscopist: Gatha Mayer , MD Age: 74 Referring MD:  Date of Birth: 1946-05-20 Gender: Male Account #: [id number] Procedure:                Colonoscopy Indications:              High risk colon cancer surveillance: Personal                            history of hereditary nonpolyposis colorectal                            cancer (Lynch Syndrome) Medicines:                Propofol per Anesthesia, Monitored Anesthesia Care Procedure:                Pre-Anesthesia Assessment:                           - Prior to the procedure, a History and Physical                            was performed, and patient medications and                            allergies were reviewed. The patient's tolerance of                            previous anesthesia was also reviewed. The risks                            and benefits of the procedure and the sedation                            options and risks were discussed with the patient.                            All questions were answered, and informed consent                            was obtained. Prior Anticoagulants: The patient has                            taken no previous anticoagulant or antiplatelet                            agents. ASA Grade Assessment: II - A patient with                            mild systemic disease. After reviewing the risks                            and benefits, the patient was deemed in  satisfactory condition to undergo the procedure.                           After obtaining informed consent, the colonoscope                            was passed under direct vision. Throughout the                            procedure, the patient's blood pressure, pulse, and                            oxygen saturations were monitored continuously. The                            Colonoscope was  introduced through the anus and                            advanced to the the cecum, identified by                            appendiceal orifice and ileocecal valve. The                            colonoscopy was performed without difficulty. The                            patient tolerated the procedure well. The quality                            of the bowel preparation was good. The bowel                            preparation used was Miralax via split dose                            instruction. The ileocecal valve, appendiceal                            orifice, and rectum were photographed. Scope In: 9:23:30 AM Scope Out: 9:36:27 AM Scope Withdrawal Time: 0 hours 9 minutes 34 seconds  Total Procedure Duration: 0 hours 12 minutes 57 seconds  Findings:                 The perianal and digital rectal examinations were                            normal. Pertinent negatives include normal prostate                            (size, shape, and consistency).                           Many small and large-mouthed diverticula were found  in the sigmoid colon, descending colon and                            ascending colon.                           The exam was otherwise without abnormality on                            direct and retroflexion views. Complications:            No immediate complications. Estimated Blood Loss:     Estimated blood loss: none. Impression:               - Diverticulosis in the sigmoid colon, in the                            descending colon and in the ascending colon.                           - The examination was otherwise normal on direct                            and retroflexion views.                           - No specimens collected.                           - Personal history of colonic polyps and Lynch                            Syndrome. Recommendation:           - Patient has a contact number available for                             emergencies. The signs and symptoms of potential                            delayed complications were discussed with the                            patient. Return to normal activities tomorrow.                            Written discharge instructions were provided to the                            patient.                           - Resume previous diet.                           - Continue present medications.                           -  Repeat colonoscopy date to be determined after                            pending gastric pathology results are reviewed. 1-2                            years Gatha Mayer, MD 07/07/2020 9:53:28 AM This report has been signed electronically.

## 2020-07-07 NOTE — Progress Notes (Signed)
VS by CW  Pt's states no medical or surgical changes since previsit or office visit.  

## 2020-07-07 NOTE — Patient Instructions (Addendum)
There were multiple tiny stomach polyps that look totally innocent and benign.  As per protocol I took biopsies to sample these and will let you know.  No polyps at all in the colon.  No signs of cancer.  You still have diverticulosis, that never goes away.  I will determine timing of repeat testing after I see these results.  I appreciate the opportunity to care for you. Gatha Mayer, MD, FACG   YOU HAD AN ENDOSCOPIC PROCEDURE TODAY AT [facility]:   Refer to the procedure report that was given to you for any specific questions about what was found during the examination.  If the procedure report does not answer your questions, please call your gastroenterologist to clarify.  If you requested that your care partner not be given the details of your procedure findings, then the procedure report has been included in a sealed envelope for you to review at your convenience later.  YOU SHOULD EXPECT: Some feelings of bloating in the abdomen. Passage of more gas than usual.  Walking can help get rid of the air that was put into your GI tract during the procedure and reduce the bloating. If you had a lower endoscopy (such as a colonoscopy or flexible sigmoidoscopy) you may notice spotting of blood in your stool or on the toilet paper. If you underwent a bowel prep for your procedure, you may not have a normal bowel movement for a few days.  Please Note:  You might notice some irritation and congestion in your nose or some drainage.  This is from the oxygen used during your procedure.  There is no need for concern and it should clear up in a day or so.  SYMPTOMS TO REPORT IMMEDIATELY:   Following lower endoscopy (colonoscopy or flexible sigmoidoscopy):  Excessive amounts of blood in the stool  Significant tenderness or worsening of abdominal pains  Swelling of the abdomen that is new, acute  Fever of 100F or higher   Following upper endoscopy (EGD)  Vomiting of blood or coffee  ground material  New chest pain or pain under the shoulder blades  Painful or persistently difficult swallowing  New shortness of breath  Fever of 100F or higher  Black, tarry-looking stools  For urgent or emergent issues, a gastroenterologist can be reached at any hour by calling [phone]. Do not use MyChart messaging for urgent concerns.    DIET:  We do recommend a small meal at first, but then you may proceed to your regular diet.  Drink plenty of fluids but you should avoid alcoholic beverages for 24 hours.  MEDICATIONS: Continue present medications.  Please see handouts given to you by your recovery nurse.  ACTIVITY:  You should plan to take it easy for the rest of today and you should NOT DRIVE or use heavy machinery until tomorrow (because of the sedation medicines used during the test).    FOLLOW UP: Our staff will call the number listed on your records 48-72 hours following your procedure to check on you and address any questions or concerns that you may have regarding the information given to you following your procedure. If we do not reach you, we will leave a message.  We will attempt to reach you two times.  During this call, we will ask if you have developed any symptoms of COVID 19. If you develop any symptoms (ie: fever, flu-like symptoms, shortness of breath, cough etc.) before then, please call [phone].  If  you test positive for Covid 19 in the 2 weeks post procedure, please call and report this information to [place].    If any biopsies were taken you will be contacted by phone or by letter within the next 1-3 weeks.  Please call us at [phone] if you have not heard about the biopsies in 3 weeks.   Thank you for allowing [place] to provide for your healthcare needs today.   SIGNATURES/CONFIDENTIALITY: You and/or your care partner have signed paperwork which will be entered into your electronic medical record.  These signatures attest to the fact that that the  information above on your After Visit Summary has been reviewed and is understood.  Full responsibility of the confidentiality of this discharge information lies with you and/or your care-partner.

## 2020-07-07 NOTE — Progress Notes (Signed)
Called to room to assist during endoscopic procedure.  Patient ID and intended procedure confirmed with present staff. Received instructions for my participation in the procedure from the performing physician.  

## 2020-07-07 NOTE — Op Note (Signed)
[place] Patient Name: Ronald Edwards Procedure Date: 07/07/2020 9:03 AM MRN: [id number] Endoscopist: Gatha Mayer , MD Age: 74 Referring MD:  Date of Birth: January 29, 1946 Gender: Male Account #: [id number] Procedure:                Upper GI endoscopy Indications:              Screening procedure, Hereditary nonpolyposis                            colorectal cancer (Lynch Syndrome) Medicines:                Propofol per Anesthesia, Monitored Anesthesia Care Procedure:                Pre-Anesthesia Assessment:                           - Prior to the procedure, a History and Physical                            was performed, and patient medications and                            allergies were reviewed. The patient's tolerance of                            previous anesthesia was also reviewed. The risks                            and benefits of the procedure and the sedation                            options and risks were discussed with the patient.                            All questions were answered, and informed consent                            was obtained. Prior Anticoagulants: The patient has                            taken no previous anticoagulant or antiplatelet                            agents. ASA Grade Assessment: II - A patient with                            mild systemic disease. After reviewing the risks                            and benefits, the patient was deemed in                            satisfactory condition to undergo the procedure.  After obtaining informed consent, the endoscope was                            passed under direct vision. Throughout the                            procedure, the patient's blood pressure, pulse, and                            oxygen saturations were monitored continuously. The                            Endoscope was introduced through the mouth, and                             advanced to the second part of duodenum. The upper                            GI endoscopy was accomplished without difficulty.                            The patient tolerated the procedure well. Scope In: Scope Out: Findings:                 Multiple diminutive sessile polyps were found in                            the gastric fundus and in the gastric body.                            Biopsies were taken with a cold forceps for                            histology. Verification of patient identification                            for the specimen was done. Estimated blood loss was                            minimal.                           The exam was otherwise without abnormality.                           The cardia and gastric fundus were normal on                            retroflexion. Complications:            No immediate complications. Estimated Blood Loss:     Estimated blood loss was minimal. Impression:               - Multiple gastric polyps. Biopsied.                           -  The examination was otherwise normal. Recommendation:           - Patient has a contact number available for                            emergencies. The signs and symptoms of potential                            delayed complications were discussed with the                            patient. Return to normal activities tomorrow.                            Written discharge instructions were provided to the                            patient.                           - Resume previous diet.                           - Continue present medications.                           - Await pathology results.                           - Repeat upper endoscopy after studies are complete                            for surveillance based on pathology results.                           - See the other procedure note for documentation of                            additional recommendations. Gatha Mayer, MD 07/07/2020 9:50:18 AM This report has been signed electronically.

## 2020-07-09 ENCOUNTER — Telehealth: Payer: Self-pay | Admitting: *Deleted

## 2020-07-09 ENCOUNTER — Telehealth: Payer: Self-pay

## 2020-07-09 NOTE — Telephone Encounter (Signed)
First post procedure follow up visit, no answer 

## 2020-07-09 NOTE — Telephone Encounter (Signed)
Pt is calling states he is doing well.

## 2020-07-09 NOTE — Telephone Encounter (Signed)
Attempted 2nd f/u phone call. No answer. Left message.  °

## 2020-07-17 ENCOUNTER — Encounter: Payer: Self-pay | Admitting: Internal Medicine

## 2020-07-17 HISTORY — PX: UPPER GASTROINTESTINAL ENDOSCOPY: SHX188

## 2020-07-30 DIAGNOSIS — Z8616 Personal history of COVID-19: Secondary | ICD-10-CM

## 2020-07-30 HISTORY — DX: Personal history of COVID-19: Z86.16

## 2020-11-27 ENCOUNTER — Telehealth: Payer: Self-pay | Admitting: Physical Medicine and Rehabilitation

## 2020-11-27 NOTE — Telephone Encounter (Signed)
Patient was referred in July 2021 by Dr. Sandi Mariscal for bilateral hip injections. Called patient to discuss/ schedule. No answer after multiple rings an no voicemail.

## 2020-11-27 NOTE — Telephone Encounter (Signed)
Pt called stating he had an MRI sent to Dr. Ernestina Patches awhile ago and because he was caring for his wife he wasn't able to make any appts but pt would like a CB now as he will have some time to ome in and get his results.   [phone]

## 2020-12-02 ENCOUNTER — Telehealth: Payer: Self-pay | Admitting: Physical Medicine and Rehabilitation

## 2020-12-02 NOTE — Telephone Encounter (Signed)
See previous message

## 2020-12-02 NOTE — Telephone Encounter (Signed)
Pt called and states he would like an appt with Dr.Newton. He said that he was suppose to have one a while ago but he was taking care of his wife and she passed away so now he's able to schedule an appt. Call him back [phone]

## 2020-12-02 NOTE — Telephone Encounter (Signed)
Scheduled for 4/20 at 1300.

## 2020-12-02 NOTE — Telephone Encounter (Signed)
Left message #1

## 2020-12-17 ENCOUNTER — Other Ambulatory Visit: Payer: Self-pay

## 2020-12-17 ENCOUNTER — Ambulatory Visit: Payer: Self-pay

## 2020-12-17 ENCOUNTER — Ambulatory Visit (INDEPENDENT_AMBULATORY_CARE_PROVIDER_SITE_OTHER): Payer: Medicare Other | Admitting: Physical Medicine and Rehabilitation

## 2020-12-17 ENCOUNTER — Encounter: Payer: Self-pay | Admitting: Physical Medicine and Rehabilitation

## 2020-12-17 DIAGNOSIS — M25552 Pain in left hip: Secondary | ICD-10-CM

## 2020-12-17 DIAGNOSIS — M25551 Pain in right hip: Secondary | ICD-10-CM

## 2020-12-17 NOTE — Progress Notes (Signed)
   WASHINGTON WHEDBEE - 75 y.o. male MRN [id number]  Date of birth: Mar 12, 1946  Office Visit Note: Visit Date: 12/17/2020 PCP: Derinda Late, MD Referred by: Derinda Late, MD  Subjective: Chief Complaint  Patient presents with  . Left Hip - Pain  . Right Hip - Pain   HPI:  Ronald Edwards is a 75 y.o. male who comes in today at the request of Dr. Derinda Late for planned Bilateral anesthetic hip arthrogram with fluoroscopic guidance.  The patient has failed conservative care including home exercise, medications, time and activity modification.  This injection will be diagnostic and hopefully therapeutic.  Please see requesting physician notes for further details and justification.   ROS Otherwise per HPI.  Assessment & Plan: Visit Diagnoses:    ICD-10-CM   1. Pain in left hip  M25.552 XR C-ARM NO REPORT    Large Joint Inj: bilateral hip joint  2. Pain in right hip  M25.551 XR C-ARM NO REPORT    Large Joint Inj: bilateral hip joint    Plan: No additional findings.   Meds & Orders: No orders of the defined types were placed in this encounter.   Orders Placed This Encounter  Procedures  . Large Joint Inj: bilateral hip joint  . XR C-ARM NO REPORT    Follow-up: No follow-ups on file.   Procedures: Large Joint Inj: bilateral hip joint on 12/17/2020 1:03 PM Indications: diagnostic evaluation and pain Details: 22 G 3.5 in needle, fluoroscopy-guided anterior approach  Arthrogram: No  Medications (Right): 60 mg triamcinolone acetonide 40 MG/ML; 3 mL bupivacaine 0.5 % Medications (Left): 60 mg triamcinolone acetonide 40 MG/ML; 3 mL bupivacaine 0.5 % Outcome: tolerated well, no immediate complications  There was excellent flow of contrast producing a partial arthrogram of the hip. The patient did have relief of symptoms during the anesthetic phase of the injection. Procedure, treatment alternatives, risks and benefits explained, specific risks discussed. Consent was given by  the patient. Immediately prior to procedure a time out was called to verify the correct patient, procedure, equipment, support staff and site/side marked as required. Patient was prepped and draped in the usual sterile fashion.          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  Bilateral gluteus medius tendinosis without tear.   Electronically Signed   By: Inge Rise M.D.   On: 03/05/2020 12:55     Objective:  VS:  HT:    WT:   BMI:     BP:   HR: bpm  TEMP: ( )  RESP:  Physical Exam   Imaging: No results found.

## 2020-12-17 NOTE — Progress Notes (Signed)
Pt state both hip pain mostly right. Pt state the pain keeps him up at night. Pt state it when he has to get up to go to the bathroom. Pt state sitting makes the pain worse. Pt state he take over the counter pain meds.   Numeric Pain Rating Scale and Functional Assessment Average Pain 0   In the last MONTH (on 0-10 scale) has pain interfered with the following?  1. General activity like being  able to carry out your everyday physical activities such as walking, climbing stairs, carrying groceries, or moving a chair?  Rating(0)   +Driver, -BT, -Dye Allergies.

## 2020-12-22 MED ORDER — TRIAMCINOLONE ACETONIDE 40 MG/ML IJ SUSP
60.0000 mg | INTRAMUSCULAR | Status: AC | PRN
  Administered 2020-12-17: 60 mg via INTRA_ARTICULAR

## 2020-12-22 MED ORDER — BUPIVACAINE HCL 0.5 % IJ SOLN
3.0000 mL | INTRAMUSCULAR | Status: AC | PRN
  Administered 2020-12-17: 3 mL via INTRA_ARTICULAR

## 2021-01-08 ENCOUNTER — Telehealth: Payer: Self-pay | Admitting: Physical Medicine and Rehabilitation

## 2021-01-08 NOTE — Telephone Encounter (Signed)
12/17/20 ov note faxed to Dr. Deboraha Sprang office [phone]

## 2021-03-05 ENCOUNTER — Other Ambulatory Visit: Payer: Self-pay

## 2021-03-05 ENCOUNTER — Ambulatory Visit
Admission: RE | Admit: 2021-03-05 | Discharge: 2021-03-05 | Disposition: A | Discharge status: Home / Self Care | Payer: Medicare Other | Source: Ambulatory Visit | Attending: Family Medicine | Admitting: Family Medicine

## 2021-03-05 ENCOUNTER — Other Ambulatory Visit: Payer: Self-pay | Admitting: Family Medicine

## 2021-03-05 DIAGNOSIS — M549 Dorsalgia, unspecified: Secondary | ICD-10-CM

## 2021-04-15 ENCOUNTER — Encounter: Payer: Self-pay | Admitting: Internal Medicine

## 2021-07-06 ENCOUNTER — Other Ambulatory Visit: Payer: Self-pay

## 2021-07-06 ENCOUNTER — Ambulatory Visit (AMBULATORY_SURGERY_CENTER): Payer: Self-pay | Admitting: *Deleted

## 2021-07-06 VITALS — Ht 72.0 in | Wt 263.0 lb

## 2021-07-06 DIAGNOSIS — Z1509 Genetic susceptibility to other malignant neoplasm: Secondary | ICD-10-CM

## 2021-07-06 NOTE — Progress Notes (Signed)
Patient's pre-visit was done today over the phone with the patient. Name,DOB and address verified. Patient denies any allergies to Eggs and Soy. Patient denies any problems with anesthesia/sedation. Patient is not taking any diet pills or blood thinners. No home Oxygen. Packet of Prep instructions mailed to patient including a copy of a consent form-pt is aware. Patient understands to call us back with any questions or concerns. Patient is aware of our care-partner policy and [id number] safety protocol.  The patient is COVID-19 vaccinated.

## 2021-07-07 ENCOUNTER — Encounter: Payer: Self-pay | Admitting: Internal Medicine

## 2021-07-20 ENCOUNTER — Encounter: Payer: Self-pay | Admitting: Internal Medicine

## 2021-07-20 ENCOUNTER — Other Ambulatory Visit: Payer: Self-pay | Admitting: Internal Medicine

## 2021-07-20 ENCOUNTER — Ambulatory Visit (AMBULATORY_SURGERY_CENTER): Payer: Medicare Other | Admitting: Internal Medicine

## 2021-07-20 VITALS — BP 114/61 | HR 54 | Temp 97.3°F | Resp 12 | Ht 72.0 in | Wt 263.0 lb

## 2021-07-20 DIAGNOSIS — Z86010 Personal history of colonic polyps: Secondary | ICD-10-CM

## 2021-07-20 DIAGNOSIS — Z1509 Genetic susceptibility to other malignant neoplasm: Secondary | ICD-10-CM

## 2021-07-20 DIAGNOSIS — D123 Benign neoplasm of transverse colon: Secondary | ICD-10-CM

## 2021-07-20 DIAGNOSIS — D128 Benign neoplasm of rectum: Secondary | ICD-10-CM

## 2021-07-20 HISTORY — PX: OTHER SURGICAL HISTORY: SHX169

## 2021-07-20 MED ORDER — SODIUM CHLORIDE 0.9 % IV SOLN: 500.0000 mL | Freq: Once | INTRAVENOUS | Status: DC

## 2021-07-20 NOTE — Progress Notes (Signed)
[facility] History and Physical   Primary Care Physician:  Derinda Late, MD   Reason for Procedure:   Lynch Syndrome  Plan:    colonoscopy     HPI: Ronald Edwards is a 75 y.o. male here for surveillance/screening colonoscopy   Past Medical History:  Diagnosis Date   Actinic keratosis    Allergic rhinitis    Allergy    Depression    Diverticulitis    Diverticulitis of colon (without mention of hemorrhage)(562.11) 01/06/2011   Diverticulosis    DJD (degenerative joint disease) of lumbar spine    Dyslipidemia    Family history of stomach cancer    Fecal incontinence    Gait disturbance    Headache(784.0)    Hearing loss    Hepatitis    B   History of colonic polyps    Hyperlipemia    controlled    Internal hemorrhoids    Low back pain    Lynch syndrome    Malaise and fatigue    Muir-Torre syndrome    Obesity    Osteoarthritis    Perianal dermatitis    Peripheral neuropathy    Pneumonia    Polyneuropathy in other diseases classified elsewhere ([place]) 11/21/2012   Prostate cancer (HCC)    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 denies- NO SURGERY per pt    COLONOSCOPY  2021   COLONOSCOPY W/ POLYPECTOMY  2002; 08/01/2007; 02/09/11   2002: diminutive adenoma(s); 2008: internal hemorrhoids 2012: hyperplastic rectal polyp, left-sided diverticulosis   HERNIA REPAIR  1982   left   NASAL SEPTOPLASTY W/ TURBINOPLASTY     PROSTATE BIOPSY     SQUAMOUS CELL CARCINOMA EXCISION     Skin cancer   TONSILLECTOMY     UMBILICAL HERNIA REPAIR  1988   UPPER GASTROINTESTINAL ENDOSCOPY      Prior to Admission medications   Medication Sig Start Date End Date Taking? Authorizing Provider  ezetimibe (ZETIA) 10 MG tablet Take 10 mg by  mouth daily. 03/28/19  Yes [provider]  finasteride (PROSCAR) 5 MG tablet Take 1 tablet by mouth daily. 12/16/17  Yes [provider]  metoprolol succinate (TOPROL-XL) 25 MG 24 hr tablet Take 25 mg by mouth daily.   Yes [provider]  Psyllium Husk POWD Mix 1 tbsp in water every night and drink 03/18/11  Yes Gatha Mayer, MD  rosuvastatin (CRESTOR) 10 MG tablet Take 10 mg by mouth 2 (two) times a week. 01/03/19  Yes [provider]  fluticasone (FLONASE) 50 MCG/ACT nasal spray Place 1 spray into both nostrils daily as needed. 12/16/17   [provider]  ibuprofen (ADVIL,MOTRIN) 200 MG tablet Take 200 mg by mouth. As needed    [provider]    Current Outpatient Medications  Medication Sig Dispense Refill   ezetimibe (ZETIA) 10 MG tablet Take 10 mg by mouth daily.     finasteride (PROSCAR) 5 MG tablet Take 1 tablet by mouth daily.     metoprolol succinate (TOPROL-XL) 25 MG 24 hr tablet Take 25 mg by mouth daily.     Psyllium Husk POWD Mix 1 tbsp in water every night and drink  0  rosuvastatin (CRESTOR) 10 MG tablet Take 10 mg by mouth 2 (two) times a week.     fluticasone (FLONASE) 50 MCG/ACT nasal spray Place 1 spray into both nostrils daily as needed.     ibuprofen (ADVIL,MOTRIN) 200 MG tablet Take 200 mg by mouth. As needed     Current Facility-Administered Medications  Medication Dose Route Frequency Provider Last Rate Last Admin   0.9 %  sodium chloride infusion  500 mL Intravenous Once Gatha Mayer, MD        Allergies as of 07/20/2021 - Review Complete 07/20/2021  Allergen Reaction Noted   Lovastatin Other (See Comments) 01/01/2011   Ondansetron hcl Other (See Comments) 01/01/2011   Penicillins Swelling and Other (See Comments) 01/01/2011   Pravastatin Other (See Comments) 01/01/2011   Simvastatin Other (See Comments) 01/01/2011   Sulfa antibiotics  05/04/2012    Family History  Problem Relation Age of Onset    Coronary artery disease Mother    COPD Mother    Diabetes Mother    Stomach cancer Paternal Grandmother    Lung cancer Father        Had a lung removed, smoker   Pneumonia Father 68       died with this   Other Father        skin moles removed, unsure of pathology   Anxiety disorder Sister        also degenerative spine disease   Allergic rhinitis Daughter        also son x2, all have spine disorders also   Clotting disorder Son    Heart failure Maternal Grandmother    Uterine cancer Maternal Grandmother        dx in her 57s-80s   Stroke Maternal Grandfather    Stroke Paternal Grandfather    Heart attack Paternal Grandfather    Colon cancer Neg Hx    Colon polyps Neg Hx    Esophageal cancer Neg Hx    Rectal cancer Neg Hx     Social History   Socioeconomic History   Marital status: Married    Spouse name: Not on file   Number of children: 3   Years of education: Not on file   Highest education level: Not on file  Occupational History   Occupation: owner used furniture/appliance store  Tobacco Use   Smoking status: Never   Smokeless tobacco: Never  Vaping Use   Vaping Use: Never used  Substance and Sexual Activity   Alcohol use: Not Currently    Comment: infrequently   Drug use: No   Review of Systems:  All other review of systems negative except as mentioned in the HPI.  Physical Exam: Vital signs BP 122/72   Pulse 66   Temp (!) 97.3 F (36.3 C)   Resp 11   Ht 6' (1.829 m)   Wt 263 lb (119.3 kg)   SpO2 99%   BMI 35.67 kg/m   General:   Alert,  Well-developed, well-nourished, pleasant and cooperative in NAD Lungs:  Clear throughout to auscultation.   Heart:  Regular rate and rhythm; no murmurs, clicks, rubs,  or gallops. Abdomen:  Soft, nontender and nondistended. Normal bowel sounds.   Neuro/Psych:  Alert and cooperative. Normal mood and affect. A and O x 3   @Nekita Pita  Simonne Maffucci, MD, [facility] [phone] (pager) 07/20/2021  8:06 AM@

## 2021-07-20 NOTE — Patient Instructions (Addendum)
I removed 2 tiny polyps that look benign. Anticipate repeat colonoscopy and upper endoscopy in about 1 year.  I appreciate the opportunity to care for you. Gatha Mayer, MD, [facility]    Handout on polyps given to patient. Await pathology results. Resume previous diet and continue present medications.   YOU HAD AN ENDOSCOPIC PROCEDURE TODAY AT [facility]:   Refer to the procedure report that was given to you for any specific questions about what was found during the examination.  If the procedure report does not answer your questions, please call your gastroenterologist to clarify.  If you requested that your care partner not be given the details of your procedure findings, then the procedure report has been included in a sealed envelope for you to review at your convenience later.  YOU SHOULD EXPECT: Some feelings of bloating in the abdomen. Passage of more gas than usual.  Walking can help get rid of the air that was put into your GI tract during the procedure and reduce the bloating. If you had a lower endoscopy (such as a colonoscopy or flexible sigmoidoscopy) you may notice spotting of blood in your stool or on the toilet paper. If you underwent a bowel prep for your procedure, you may not have a normal bowel movement for a few days.  Please Note:  You might notice some irritation and congestion in your nose or some drainage.  This is from the oxygen used during your procedure.  There is no need for concern and it should clear up in a day or so.  SYMPTOMS TO REPORT IMMEDIATELY:  Following lower endoscopy (colonoscopy or flexible sigmoidoscopy):  Excessive amounts of blood in the stool  Significant tenderness or worsening of abdominal pains  Swelling of the abdomen that is new, acute  Fever of 100F or higher  For urgent or emergent issues, a gastroenterologist can be reached at any hour by calling [phone]. Do not use MyChart messaging for urgent concerns.     DIET:  We do recommend a small meal at first, but then you may proceed to your regular diet.  Drink plenty of fluids but you should avoid alcoholic beverages for 24 hours.  ACTIVITY:  You should plan to take it easy for the rest of today and you should NOT DRIVE or use heavy machinery until tomorrow (because of the sedation medicines used during the test).    FOLLOW UP: Our staff will call the number listed on your records 48-72 hours following your procedure to check on you and address any questions or concerns that you may have regarding the information given to you following your procedure. If we do not reach you, we will leave a message.  We will attempt to reach you two times.  During this call, we will ask if you have developed any symptoms of COVID 19. If you develop any symptoms (ie: fever, flu-like symptoms, shortness of breath, cough etc.) before then, please call [phone].  If you test positive for Covid 19 in the 2 weeks post procedure, please call and report this information to [place].    If any biopsies were taken you will be contacted by phone or by letter within the next 1-3 weeks.  Please call us at [phone] if you have not heard about the biopsies in 3 weeks.    SIGNATURES/CONFIDENTIALITY: You and/or your care partner have signed paperwork which will be entered into your electronic medical record.  These signatures attest to the  fact that that the information above on your After Visit Summary has been reviewed and is understood.  Full responsibility of the confidentiality of this discharge information lies with you and/or your care-partner.

## 2021-07-20 NOTE — Op Note (Signed)
[facility] Patient Name: Ronald Edwards Procedure Date: 07/20/2021 7:50 AM MRN: [id number] Endoscopist: Gatha Mayer , MD Age: 75 Referring MD:  Date of Birth: 11-01-1945 Gender: Male Account #: [id number] Procedure:                Colonoscopy Indications:              High risk colon cancer surveillance: Personal                            history of hereditary nonpolyposis colorectal                            cancer (Lynch Syndrome) Medicines:                Propofol per Anesthesia, Monitored Anesthesia Care Procedure:                Pre-Anesthesia Assessment:                           - Prior to the procedure, a History and Physical                            was performed, and patient medications and                            allergies were reviewed. The patient's tolerance of                            previous anesthesia was also reviewed. The risks                            and benefits of the procedure and the sedation                            options and risks were discussed with the patient.                            All questions were answered, and informed consent                            was obtained. Prior Anticoagulants: The patient has                            taken no previous anticoagulant or antiplatelet                            agents. ASA Grade Assessment: II - A patient with                            mild systemic disease. After reviewing the risks                            and benefits, the patient was deemed in  satisfactory condition to undergo the procedure.                           After obtaining informed consent, the colonoscope                            was passed under direct vision. Throughout the                            procedure, the patient's blood pressure, pulse, and                            oxygen saturations were monitored continuously. The                            CF [id number] #[id number]  was introduced through the anus                            and advanced to the the cecum, identified by                            appendiceal orifice and ileocecal valve. The                            colonoscopy was performed without difficulty. The                            patient tolerated the procedure well. The quality                            of the bowel preparation was good. The ileocecal                            valve, appendiceal orifice, and rectum were                            photographed. The bowel preparation used was                            Miralax via split dose instruction. Scope In: 8:10:20 AM Scope Out: 8:22:39 AM Scope Withdrawal Time: 0 hours 10 minutes 28 seconds  Total Procedure Duration: 0 hours 12 minutes 19 seconds  Findings:                 The digital rectal exam findings include decreased                            sphincter tone. Pertinent negatives include normal                            prostate (size, shape, and consistency).                           Two sessile polyps were found in the rectum and  transverse colon. The polyps were diminutive in                            size. These polyps were removed with a cold snare.                            Resection and retrieval were complete. Verification                            of patient identification for the specimen was                            done. Estimated blood loss was minimal.                           Multiple diverticula were found in the entire colon.                           The exam was otherwise without abnormality on                            direct and retroflexion views. Complications:            No immediate complications. Estimated Blood Loss:     Estimated blood loss was minimal. Impression:               - Decreased sphincter tone found on digital rectal                            exam.                           - Two diminutive polyps  in the rectum and in the                            transverse colon, removed with a cold snare.                            Resected and retrieved.                           - Diverticulosis in the entire examined colon.                           - The examination was otherwise normal on direct                            and retroflexion views. Recommendation:           - Patient has a contact number available for                            emergencies. The signs and symptoms of potential                            delayed complications were  discussed with the                            patient. Return to normal activities tomorrow.                            Written discharge instructions were provided to the                            patient.                           - Resume previous diet.                           - Continue present medications.                           - Repeat colonoscopy in 1 year for surveillance.                            Lynch Syndrome Gatha Mayer, MD 07/20/2021 8:33:18 AM This report has been signed electronically.

## 2021-07-20 NOTE — Progress Notes (Signed)
Called to room to assist during endoscopic procedure.  Patient ID and intended procedure confirmed with present staff. Received instructions for my participation in the procedure from the performing physician.  

## 2021-07-20 NOTE — Progress Notes (Signed)
A and O x3. Report to RN. Tolerated MAC anesthesia well. 

## 2021-07-20 NOTE — Progress Notes (Signed)
Pt's states no medical or surgical changes since previsit or office visit.   VS taken by NS

## 2021-07-22 ENCOUNTER — Telehealth: Payer: Self-pay | Admitting: *Deleted

## 2021-07-22 NOTE — Telephone Encounter (Signed)
  Follow up Call-  Call back number 07/20/2021 07/07/2020  Post procedure Call Back phone  # [phone]  Permission to leave phone message Yes Yes  Some recent data might be hidden     Patient questions:   Message left to call us if necessary.

## 2021-07-26 ENCOUNTER — Encounter: Payer: Self-pay | Admitting: Internal Medicine

## 2021-07-27 ENCOUNTER — Telehealth: Payer: Self-pay | Admitting: Internal Medicine

## 2021-07-27 NOTE — Telephone Encounter (Signed)
Inbound call from patient. States he received a call 11/27 around 9pm. He wondered if it was his results from procedure.

## 2021-07-27 NOTE — Telephone Encounter (Signed)
Left Message for pt to call back  °

## 2021-07-27 NOTE — Telephone Encounter (Signed)
Pt was notified of recent results by reading letter created for pt by Dr. Carlean Purl. Pt verbalized understanding with all questions answered.

## 2021-09-23 ENCOUNTER — Other Ambulatory Visit (HOSPITAL_COMMUNITY): Payer: Self-pay | Admitting: Urology

## 2021-09-23 DIAGNOSIS — C61 Malignant neoplasm of prostate: Secondary | ICD-10-CM

## 2021-10-02 ENCOUNTER — Encounter (HOSPITAL_COMMUNITY)
Admission: RE | Admit: 2021-10-02 | Discharge: 2021-10-02 | Disposition: A | Discharge status: Home / Self Care | Payer: Medicare Other | Source: Ambulatory Visit | Attending: Urology | Admitting: Urology

## 2021-10-02 ENCOUNTER — Other Ambulatory Visit: Payer: Self-pay

## 2021-10-02 DIAGNOSIS — C61 Malignant neoplasm of prostate: Secondary | ICD-10-CM | POA: Insufficient documentation

## 2021-10-02 NOTE — Progress Notes (Signed)
I called pt to introduce myself as the Prostate Nurse Navigator and the Coordinator of the [facility]state [place].   1. I confirmed with the patient he is aware of his referral to the clinic on 2/10, arriving @ 8:00am.    2. I discussed the format of the clinic and the physicians he will be seeing that day.   3. I discussed where the clinic is located and how to contact me.   4. I confirmed his address and informed him I would be mailing a packet of information and forms to be completed. Patient has imaging scan scheduled for today and requested to pick up in person. I asked him to bring them with him the day of his appointment.    He voiced understanding of the above. I asked him to call me if he has any questions or concerns regarding his appointments or the forms he needs to complete.

## 2021-10-08 ENCOUNTER — Other Ambulatory Visit: Payer: Self-pay

## 2021-10-08 ENCOUNTER — Encounter (HOSPITAL_COMMUNITY)
Admission: RE | Admit: 2021-10-08 | Discharge: 2021-10-08 | Disposition: A | Discharge status: Home / Self Care | Payer: Medicare Other | Source: Ambulatory Visit | Attending: Urology | Admitting: Urology

## 2021-10-08 DIAGNOSIS — C61 Malignant neoplasm of prostate: Secondary | ICD-10-CM | POA: Insufficient documentation

## 2021-10-08 MED ORDER — PIFLIFOLASTAT F 18 (PYLARIFY) INJECTION
9.0000 | Freq: Once | INTRAVENOUS | Status: AC
  Administered 2021-10-08: 9.1 via INTRAVENOUS

## 2021-10-08 NOTE — Progress Notes (Signed)
Left message for patient to call back regarding [facility] appointment reminder on 10/09/2021 @ 8am.

## 2021-10-08 NOTE — Progress Notes (Addendum)
° °                              Care Plan Summary  Name: Ronald Edwards DOB: 1946/04/26   Your Medical Team:   Urologist -  Dr. Raynelle Bring, [facility]  Radiation Oncologist - Dr. Tyler Pita, [facility]   Medical Oncologist - Dr. Zola Button, [facility]  Recommendations: 1) Radiation Therapy     * These recommendations are based on information available as of todays consult.      Recommendations may change depending on the results of further tests or exams.    Next Steps: 1)  Dr. Johny Shears office will contact you to set up radiation.    When appointments need to be scheduled, you will be contacted by [facility] and/or [facility].  Questions?  Please do not hesitate to call Katheren Puller, BSN, RN at [phone] with any questions or concerns.  [name] is your Oncology Nurse Navigator and is available to assist you while youre receiving your medical care at [facility].

## 2021-10-09 ENCOUNTER — Ambulatory Visit
Admission: RE | Admit: 2021-10-09 | Discharge: 2021-10-09 | Disposition: A | Discharge status: Home / Self Care | Payer: Medicare Other | Source: Ambulatory Visit | Attending: Radiation Oncology | Admitting: Radiation Oncology

## 2021-10-09 ENCOUNTER — Inpatient Hospital Stay: Payer: Medicare Other | Attending: Oncology | Admitting: Oncology

## 2021-10-09 VITALS — BP 123/66 | HR 66 | Temp 96.9°F | Resp 18

## 2021-10-09 DIAGNOSIS — Z79899 Other long term (current) drug therapy: Secondary | ICD-10-CM | POA: Diagnosis not present

## 2021-10-09 DIAGNOSIS — C61 Malignant neoplasm of prostate: Secondary | ICD-10-CM | POA: Insufficient documentation

## 2021-10-09 DIAGNOSIS — Z85828 Personal history of other malignant neoplasm of skin: Secondary | ICD-10-CM | POA: Insufficient documentation

## 2021-10-09 NOTE — Consult Note (Signed)
[facility]     10/09/2021   --------------------------------------------------------------------------------   Ronald Edwards  MRN: [id number]  DOB: [birth year], 76 year old Male  SSN: [id number]   PRIMARY CARE:  Marylene Land, MD  REFERRING:  Raynelle Bring, Eduardo Osier  PROVIDER:  Raynelle Bring, M.D.  LOCATION:  [facility] [phone]     --------------------------------------------------------------------------------   CC/HPI: CC: Prostate Cancer   PCP: Dr. Derinda Late  Location of consult: [name]   Ronald Edwards is a 76 year old gentleman with a past medical history of hyperlipidemia, hypertension, and BPH. He was initially diagnosed with very low risk prostate cancer in January 2010. At that time, his PSA was 6.28 and his initial biopsy on 09/03/08 indicated 2 cores of Gleason 3+3=6 adenocarcinoma with 5% and 10% involvement. After discussion, he elected active surveillance. He was started on Avodart at that time by his urologist, Dr. Rosana Hoes. He subsequently transferred his care to me after Dr. Rosana Hoes left [place]. Subsequent surveillance biopsies in July 2010, January 2012, February 2014, and March 2016. An MRI of the prostate in May 2018 was performed and did not demonstrate concerning lesions. He was diagnosed with a PMS2 mutation c/w Lynch syndrome in 2019. His PSA has remained consistent since starting 5 alpha reductase inhibitor therapy with his most recent PSA having been 2.41 in November 2022. He has remained on both finasteride 5 mg and tamsulosin 0.4 mg for many years now with adequate control of his LUTS. He had been taking care of his wife for a few years who was quite debilitated before her death and we delayed any further invasive procedures for him during that time. However, on 09/09/21 he did finally proceed with another TRUS biopsy of the prostate and this indicated Gleason 4+3=7  adenocarcinoma with 3 out of 12 biopsy cores positive but only one core with upgraded disease. Only 10% of that core was positive and 60% was pattern 4.   Family history: None.   Imaging studies: PSMA PET (10/08/21): Negative for metastatic disease.   PMH: He has a history of hyperlipidemia, hypertension, and BPH.  PSH: No abdominal surgeries.   TNM stage: cT1c Nx Mx  PSA: 2.41  Gleason score: 4+3=7 (GG 3)  Biopsy (09/09/21): 3/12 cores positive  Left: L base (10%, 4+3=7), L lateral base (10%, 3+3=6)  Right: R apex (10%, 3+3=6)  Prostate volume: 66.2 cc  PSAD: 0.07    Urinary function: IPSS is 17.  Erectile function: SHIM score is N/A.     ALLERGIES: Penicillins    MEDICATIONS: Crestor 10 mg tablet  Finasteride 5 mg tablet  Levofloxacin 750 mg tablet Please take one tablet the morning of your biopsy.  Tamsulosin Hcl 0.4 mg capsule  Fluticasone Propionate 50 mcg/actuation spray, suspension 0 Nasal  Metamucil  Metoprolol Succinate 25 mg tablet, extended release 24 hr Oral  Zetia 10 mg tablet     GU PSH: Biopsy Skin Lesion - 2014 Prostate Needle Biopsy - 09/09/2021       PSH Notes: Biopsy Skin   NON-GU PSH: Surgical Pathology, Gross And Microscopic Examination For Prostate Needle - 09/09/2021     GU PMH: Prostate Cancer - 09/09/2021, - 07/14/2021, - 09/30/2020, Prostate cancer, - 2017 BPH w/LUTS - 07/14/2021, - 09/30/2020, Benign prostatic hyperplasia with urinary obstruction, - 2017 Nocturia - 07/14/2021, - 09/30/2020, - 2018      PMH Notes:   1) Prostate cancer: He was  initially diagnosed with prostate cancer in January 2010 when he was diagnosed with clinical stage T1cNxMx, Gleason 3+3=6 adenocarcinoma in 2 foci of the prostate. After discussing management options, he elected to proceed with active surveillance. He began Avodart under Dr. Rosana Hoes' care shortly after his diagnosis. He was noted to have a PMS2 mutation c/w Lynch Syndrome in 2019.   Initial diagnosis: January  2010  TNM stage: cT1c Nx Mx  PSA at diagnosis: 6.28  Gleason score: 3+3=6  Biopsy (09/03/08): 2/12 cores -- L mid (10%), L base (5%), Vol 96 cc   Surveillance:  Jul 2010: 2/12 cores -- L lat mid (2%), L base (5%), Vol 71.8 cc  Jan 2012: 1/20 cores -- L mid (5%), Vol 57.2 cc  Feb 2014: R base (HGPIN), L lateral apex (atypia), Vol 60.2 cc  Mar 2016: 12 cores biopsy - 1/12 cores -- L lateral mid (5%, Vol 54.8 cc)  May 2018: MRI - No suspicious lesions for high grade disease  Aug 2019: Diagnosed with PMS2 mutation c/w Lynch Syndrome   2) BPH/LUTS: He is treated with Avodart which he began in early 2010. This was changed to finasteride in 2017 for cost purposes.   Current treatment: Finasteride 5 mg  Prior treatment: Tamsulosin (he had taken this intermittently but developed related syncopal episodes and was instructed to stop this medication)     NON-GU PMH: Arthritis Hypercholesterolemia Skin Cancer, History    FAMILY HISTORY: Diabetes - Mother   SOCIAL HISTORY: Marital Status: Married Preferred Language: English; Ethnicity: Not Hispanic Or Latino; Race: White Current Smoking Status: Patient has never smoked.  Social Drinker.  Drinks 1 caffeinated drink per day.     Notes: Never A Smoker, Occupation:, Alcohol Use, Marital History - Currently Married   REVIEW OF SYSTEMS:    GU Review Male:   Patient denies frequent urination, hard to postpone urination, burning/ pain with urination, get up at night to urinate, leakage of urine, stream starts and stops, trouble starting your streams, and have to strain to urinate .  Gastrointestinal (Lower):   Patient denies diarrhea and constipation.  Gastrointestinal (Upper):   Patient denies nausea and vomiting.  Constitutional:   Patient denies fever, weight loss, fatigue, and night sweats.  Skin:   Patient denies skin rash/ lesion and itching.  Eyes:   Patient denies blurred vision and double vision.  Ears/ Nose/ Throat:   Patient denies sore  throat and sinus problems.  Hematologic/Lymphatic:   Patient denies swollen glands and easy bruising.  Cardiovascular:   Patient denies leg swelling and chest pains.  Respiratory:   Patient denies cough and shortness of breath.  Endocrine:   Patient denies excessive thirst.  Musculoskeletal:   Patient denies back pain and joint pain.  Neurological:   Patient denies headaches and dizziness.  Psychologic:   Patient denies depression and anxiety.   VITAL SIGNS: None   MULTI-SYSTEM PHYSICAL EXAMINATION:    Constitutional: Well-nourished. No physical deformities. Normally developed. Good grooming.     Complexity of Data:  Lab Test Review:   PSA  Records Review:   Pathology Reports, Previous Patient Records  X-Ray Review: PET Scan: Reviewed Films.     07/08/21 09/24/20 02/29/20 08/29/19 02/16/19 08/02/18 12/29/17 06/06/17  PSA  Total PSA 2.41 ng/mL 2.10 ng/mL 1.95 ng/mL 2.65 ng/mL 2.32 ng/mL 2.21 ng/mL 1.60 ng/mL 1.89 ng/mL   Notes:                     CLINICAL  DATA: Prostate cancer diagnosed 15 years free air. Recent  prostate cancer biopsy positive for 3+3 and 3+4 prostate  adenocarcinoma.   EXAM:  NUCLEAR MEDICINE PET SKULL BASE TO THIGH   TECHNIQUE:  9.1 mCi F18 Piflufolastat (Pylarify) was injected intravenously.  Full-ring PET imaging was performed from the skull base to thigh  after the radiotracer. CT data was obtained and used for attenuation  correction and anatomic localization.   COMPARISON: None.   FINDINGS:  NECK   No radiotracer avid lymph nodes in the neck.   Incidental CT finding: None   CHEST   No radiotracer accumulation within mediastinal or hilar lymph nodes.  No suspicious pulmonary nodules on the CT scan.   Incidental CT finding: None   ABDOMEN/PELVIS   Prostate: Focal radiotracer activity in posterior LEFT prostate  gland towards the base with SUV max equal 8.9. This is small region  estimated at 5 to 10 mm in size (image 42). Heterogeneous  activity  elsewhere within the prostate gland.   Lymph nodes: No abnormal radiotracer accumulation within pelvic or  abdominal nodes.   Liver: No evidence of liver metastasis   Incidental CT finding: LEFT colon diverticulosis. Bilateral fat  filled inguinal hernias.   SKELETON   No focal activity to suggest skeletal metastasis.   IMPRESSION:  1. Focal radiotracer activity at the posterior LEFT base concerning  for local prostate adenocarcinoma recurrence.  2. No evidence metastatic adenopathy in the pelvis or periaortic  retroperitoneum.  3. No evidence of visceral metastasis or skeletal metastasis.    Electronically Signed  By: Suzy Bouchard M.D.  On: 10/09/2021 08:33   PROCEDURES: None   ASSESSMENT:      ICD-10 Details  1 GU:   Prostate Cancer - C61   2   BPH w/LUTS - N40.1   3   Nocturia - R35.1    PLAN:           Document Letter(s):  Created for Patient: Clinical Summary         Notes:   1. Low volume, unfavorable intermediate risk prostate cancer: I had a detailed discussion with Ronald Edwards today and we reviewed his situation in detail. We discussed how his prior diagnosis of low risk cancer had now been upgraded based on his most recent biopsy results. However, he still has very early stage and very curable disease. Taking into account his age, life expectancy, and disease parameters, we reviewed options for treatment. He does wish to proceed with therapy for curative intent.   The patient was counseled about the natural history of prostate cancer and the standard treatment options that are available for prostate cancer. It was explained to him how his age and life expectancy, clinical stage, Gleason score/prognostic grade group, and PSA (and PSA density) affect his prognosis, the decision to proceed with additional staging studies, as well as how that information influences recommended treatment strategies. We discussed the roles for active surveillance, radiation  therapy, surgical therapy, androgen deprivation, as well as ablative therapy and other investigational options for the treatment of prostate cancer as appropriate to his individual cancer situation. We discussed the risks and benefits of these options with regard to their impact on cancer control and also in terms of potential adverse events, complications, and impact on quality of life particularly related to urinary and sexual function. The patient was encouraged to ask questions throughout the discussion today and all questions were answered to his stated satisfaction. In addition, the patient was  provided with and/or directed to appropriate resources and literature for further education about prostate cancer and treatment options.   He has met with Dr. Tammi Klippel and radiation oncology, Dr. Alen Blew and medical oncology, and myself today. After discussion, he is interested in proceeding with external beam radiation therapy. It was agreed that based on his low-volume disease that he did not need short-term androgen deprivation therapy. He will therefore be scheduled to proceed with external beam radiation therapy and will follow-up with me in approximately 6 months to begin PSA surveillance.   CC: Dr. Derinda Late  Dr. Tyler Pita  Dr. Zola Button    E & M CODES: We spent 38 minutes dedicated to evaluation and management time, including face to face interaction, discussions on coordination of care, documentation, result review, and discussion with others as applicable.

## 2021-10-09 NOTE — Progress Notes (Signed)
Reason for the request:    Prostate cancer  HPI: I was asked by Dr. Alinda Money to evaluate Mr. Ronald Edwards for the diagnosis of prostate cancer.  He is a 76 year old man with a reasonably healthy with diagnosis of prostate cancer dating back to 2010.  At that time he was found to have a PSA of 6.28 and a Gleason score of 3+3 equal 6.  He has been on active surveillance with a repeat biopsy over the years without any evidence of current disease or increase in the grade of his disease including biopsies in 2012, 2014, 2016, 2018 and 2019.  His most recent PSA in November 2022 was 2.41 and a repeat biopsy completed on September 09, 2021 showed a Gleason score of 4+3 =7 in 10% of one of the cores in addition to Gleason score 6 and 2 other cores.  He is asymptomatic at this time from these findings.  He denies any frequency urgency or hesitancy.  His performance status and quality of life remained reasonable.  His PET scans showed no evidence of metastatic disease completed on 10/08/2021.  He does not report any headaches, blurry vision, syncope or seizures. Does not report any fevers, chills or sweats.  Does not report any cough, wheezing or hemoptysis.  Does not report any chest pain, palpitation, orthopnea or leg edema.  Does not report any nausea, vomiting or abdominal pain.  Does not report any constipation or diarrhea.  Does not report any skeletal complaints.    Does not report frequency, urgency or hematuria.  Does not report any skin rashes or lesions. Does not report any heat or cold intolerance.  Does not report any lymphadenopathy or petechiae.  Does not report any anxiety or depression.  Remaining review of systems is negative.     Past Medical History:  Diagnosis Date   Actinic keratosis    Allergic rhinitis    Allergy    Depression    Diverticulitis    Diverticulitis of colon (without mention of hemorrhage)(562.11) 01/06/2011   Diverticulosis    DJD (degenerative joint disease) of lumbar spine     Dyslipidemia    Family history of stomach cancer    Fecal incontinence    Gait disturbance    Headache(784.0)    Hearing loss    Hepatitis    B   History of colonic polyps    Hyperlipemia    controlled    Internal hemorrhoids    Low back pain    Lynch syndrome    Malaise and fatigue    Muir-Torre syndrome    Obesity    Osteoarthritis    Perianal dermatitis    Peripheral neuropathy    Pneumonia    Polyneuropathy in other diseases classified elsewhere ([place]) 11/21/2012   Prostate cancer (HCC)    PVC's (premature ventricular contractions)    with 4 beat VT   Sebaceous adenoma    Skin cancer    Skin lesion    atypical, left lower leg  :   Past Surgical History:  Procedure Laterality Date   ANKLE SURGERY Right    Charcot joint right ankle requiring surgery  pt. denies- NO SURGERY per pt    COLONOSCOPY  2021   COLONOSCOPY W/ POLYPECTOMY  2002; 08/01/2007; 02/09/11   2002: diminutive adenoma(s); 2008: internal hemorrhoids 2012: hyperplastic rectal polyp, left-sided diverticulosis   HERNIA REPAIR  1982   left   NASAL SEPTOPLASTY W/ TURBINOPLASTY     PROSTATE BIOPSY     SQUAMOUS  CELL CARCINOMA EXCISION     Skin cancer   TONSILLECTOMY     UMBILICAL HERNIA REPAIR  1988   UPPER GASTROINTESTINAL ENDOSCOPY    :   Current Outpatient Medications:    ezetimibe (ZETIA) 10 MG tablet, Take 10 mg by mouth daily., Disp: , Rfl:    finasteride (PROSCAR) 5 MG tablet, Take 1 tablet by mouth daily., Disp: , Rfl:    fluticasone (FLONASE) 50 MCG/ACT nasal spray, Place 1 spray into both nostrils daily as needed., Disp: , Rfl:    ibuprofen (ADVIL,MOTRIN) 200 MG tablet, Take 200 mg by mouth. As needed, Disp: , Rfl:    metoprolol succinate (TOPROL-XL) 25 MG 24 hr tablet, Take 25 mg by mouth daily., Disp: , Rfl:    Psyllium Husk POWD, Mix 1 tbsp in water every night and drink, Disp: , Rfl: 0   rosuvastatin (CRESTOR) 10 MG tablet, Take 10 mg by mouth 2 (two) times a week., Disp: , Rfl:  :   Allergies  Allergen Reactions   Lovastatin Other (See Comments)    fatigue   Ondansetron Hcl Other (See Comments)    headache   Penicillins Swelling and Other (See Comments)     Sweating- could not walk age 49 after taking per pt.    Pravastatin Other (See Comments)    Arthralgia, Myalgias   Simvastatin Other (See Comments)     Myalgias   Sulfa Antibiotics   :   Family History  Problem Relation Age of Onset   Coronary artery disease Mother    COPD Mother    Diabetes Mother    Stomach cancer Paternal Grandmother    Lung cancer Father        Had a lung removed, smoker   Pneumonia Father 56       died with this   Other Father        skin moles removed, unsure of pathology   Anxiety disorder Sister        also degenerative spine disease   Allergic rhinitis Daughter        also son x2, all have spine disorders also   Clotting disorder Son    Heart failure Maternal Grandmother    Uterine cancer Maternal Grandmother        dx in her 103s-80s   Stroke Maternal Grandfather    Stroke Paternal Grandfather    Heart attack Paternal Grandfather    Colon cancer Neg Hx    Colon polyps Neg Hx    Esophageal cancer Neg Hx    Rectal cancer Neg Hx   :   Social History   Socioeconomic History   Marital status: Married    Spouse name: Not on file   Number of children: 3   Years of education: Not on file   Highest education level: Not on file  Occupational History   Occupation: owner used furniture/appliance store  Tobacco Use   Smoking status: Never   Smokeless tobacco: Never  Vaping Use   Vaping Use: Never used  Substance and Sexual Activity   Alcohol use: Not Currently    Comment: infrequently   Drug use: No   Sexual activity: Not on file  Other Topics Concern   Not on file  Social History Narrative   Not on file   Social Determinants of Health   Financial Resource Strain: Not on file  Food Insecurity: Not on file  Transportation Needs: Not on file   Physical Activity: Not on file  Stress: Not on file  Social Connections: Not on file  Intimate Partner Violence: Not on file  :  Pertinent items are noted in HPI.  Exam:  General appearance: alert and cooperative appeared without distress. Head: atraumatic without any abnormalities. Eyes: conjunctivae/corneas clear. PERRL.  Sclera anicteric. Throat: lips, mucosa, and tongue normal; without oral thrush or ulcers. Resp: clear to auscultation bilaterally without rhonchi, wheezes or dullness to percussion. Cardio: regular rate and rhythm, S1, S2 normal, no murmur, click, rub or gallop GI: soft, non-tender; bowel sounds normal; no masses,  no organomegaly Skin: Skin color, texture, turgor normal. No rashes or lesions Lymph nodes: Cervical, supraclavicular, and axillary nodes normal. Neurologic: Grossly normal without any motor, sensory or deep tendon reflexes. Musculoskeletal: No joint deformity or effusion.   NM PET (PSMA) SKULL TO MID THIGH  Result Date: 10/09/2021 CLINICAL DATA:  Prostate cancer diagnosed 15 years free air. Recent prostate cancer biopsy positive for 3+3 and 3+4 prostate adenocarcinoma. EXAM: NUCLEAR MEDICINE PET SKULL BASE TO THIGH TECHNIQUE: 9.1 mCi F18 Piflufolastat (Pylarify) was injected intravenously. Full-ring PET imaging was performed from the skull base to thigh after the radiotracer. CT data was obtained and used for attenuation correction and anatomic localization. COMPARISON:  None. FINDINGS: NECK No radiotracer avid lymph nodes in the neck. Incidental CT finding: None CHEST No radiotracer accumulation within mediastinal or hilar lymph nodes. No suspicious pulmonary nodules on the CT scan. Incidental CT finding: None ABDOMEN/PELVIS Prostate: Focal radiotracer activity in posterior LEFT prostate gland towards the base with SUV max equal 8.9. This is small region estimated at 5 to 10 mm in size (image 42). Heterogeneous activity elsewhere within the prostate gland.  Lymph nodes: No abnormal radiotracer accumulation within pelvic or abdominal nodes. Liver: No evidence of liver metastasis Incidental CT finding: LEFT colon diverticulosis. Bilateral fat filled inguinal hernias. SKELETON No focal  activity to suggest skeletal metastasis. IMPRESSION: 1. Focal radiotracer activity at the posterior LEFT base concerning for local prostate adenocarcinoma recurrence. 2. No evidence metastatic adenopathy in the pelvis or periaortic retroperitoneum. 3. No evidence of visceral metastasis or skeletal metastasis. Electronically Signed   By: Suzy Bouchard M.D.   On: 10/09/2021 08:33    Assessment and Plan:    76 year old with prostate cancer diagnosed in 2010 showing with a Gleason score of 6 and has been on active surveillance since that time.  He developed Gleason score 4+3 = 7 in January 2023 and 1 core that has 10% cancer.  His case was discussed today in the prostate cancer multidisciplinary clinic including review of his pathology results with reviewing pathologist as well as reviewing his PET scan with radiology.  Treatment options including continued active surveillance versus definitive therapy with radiation reviewed.  Primary surgical therapy would likely carry more morbidity due to his age specifically incontinence.  The role for systemic therapy was discussed today in detail.  These include androgen deprivation therapy, oral targeted therapy, systemic chemotherapy among others were reviewed and are deferred he developed metastatic disease.  These modalities are palliative and do not offer cure if he has metastatic disease in the future.  All his questions are answered today and he is leaning to have definitive therapy with radiation in the near future.   30  minutes were dedicated to this visit. The time was spent on reviewing laboratory data, imaging studies, discussing treatment options, reviewing pathology results and answering questions regarding future  plan.    A copy of this consult has been forwarded to  the requesting physician.

## 2021-10-09 NOTE — Progress Notes (Signed)
Radiation Oncology         ([phone]) [phone] ________________________________  Multidisciplinary Prostate Cancer Clinic  Initial Radiation Oncology Consultation  Name: Ronald Edwards MRN: [id number]  Date: 10/09/2021  DOB: 1945-10-04  [id number], Ronald Salina, MD  Ronald Bring, MD   REFERRING PHYSICIAN: Raynelle Bring, MD  DIAGNOSIS: 76 y.o. gentleman with stage T1c adenocarcinoma of the prostate with a Gleason's score of 4+3 and a PSA of 4.8 (adjusted for finasteride).    ICD-10-CM   1. Adenocarcinoma of prostate ([place])  C61       HISTORY OF PRESENT ILLNESS::Ronald Edwards is a 76 y.o. gentleman. He has been followed by urology since at least 2008 for an elevated PSA. He had two prior negative prostate biopsies in 2008 and when his PSA rose to 6.28 in 07/2008, he had a repeat biopsy on 09/03/08 by Ronald Edwards which showed very low volume Gleason 3+3 prostate cancer in 2 of 12 cores.  He elected to proceed with active surveillance, and was also started on Avodart, at that time. He subsequently underwent repeat surveillance biopsies in 02/2009 and 08/2010 at an outside facility and 09/2012 and 10/2014 at [facility], all with stable Gleason 6 disease and no evidence of disease progression.  A surveillance prostate MRI in 12/2016 did not show any suspicious lesions. His digital rectal examination has remained negative and PSA has remained stable between 1.5 -2.6 (on finasteride) throughout the years.  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     His most recent PSA remained stable at 2.41 on 07/14/2021 and he agreed to proceed with a surveillance prostate biopsy on 09/09/21. The prostate volume measured 66.2 cc.  Out of 12 core biopsies, 3 were positive.  The maximum Gleason score was 4+3, and this was seen in the left base. Additionally, Gleason 3+3 was seen in the right apex and left base lateral. All cores involved only 10% of tissue.    He underwent PSMA PET scan on  10/08/21, showing focal radiotracer activity at the posterior left base concerning for local prostate adenocarcinoma but no evidence metastatic adenopathy, visceral metastasis, or skeletal metastasis.    The patient reviewed the biopsy and imaging results with his urologist and he has kindly been referred today to the multidisciplinary prostate cancer clinic for presentation of pathology and radiology studies in our conference for discussion of potential radiation treatment options and clinical evaluation.  Of note,  in February 2019, Ronald Edwards had a skin shaving that found a sebaceous carcinoma on his upper back.  At that time he was given the diagnosis of Muir-Torre syndrome and referred to Ronald Edwards at [place] for colonoscopy.  Ronald Edwards referred the patient to genetics for additional follow up for Lynch syndrome/Muir-Torre syndrome. He underwent genetic testing on 03/22/18 here at the [facility]. 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   PREVIOUS RADIATION THERAPY: No  PAST MEDICAL HISTORY:  has a past medical history of Actinic keratosis, Allergic rhinitis, Allergy, Depression, Diverticulitis, Diverticulitis of colon (without mention of hemorrhage)(562.11) (01/06/2011), Diverticulosis, DJD (degenerative joint disease) of lumbar spine, Dyslipidemia, Family history of stomach cancer, Fecal incontinence, Gait disturbance, Headache(784.0), Hearing loss, Hepatitis, History of colonic polyps, Hyperlipemia, Internal hemorrhoids, Low back pain, Lynch syndrome, Malaise and fatigue, Muir-Torre syndrome, Obesity, Osteoarthritis, Perianal dermatitis, Peripheral neuropathy, Pneumonia, Polyneuropathy in other diseases classified elsewhere ([place]) (11/21/2012), Prostate cancer ([place]), PVC's (premature ventricular contractions), Sebaceous adenoma, Skin cancer, and Skin lesion.  PAST SURGICAL HISTORY: Past Surgical History:  Procedure Laterality Date   ANKLE SURGERY Right    Charcot  joint right ankle requiring surgery  pt. denies- NO SURGERY per pt    COLONOSCOPY  2021   COLONOSCOPY W/ POLYPECTOMY  2002; 08/01/2007; 02/09/11   2002: diminutive adenoma(s); 2008: internal hemorrhoids 2012: hyperplastic rectal polyp, left-sided diverticulosis   HERNIA REPAIR  1982   left   NASAL SEPTOPLASTY W/ TURBINOPLASTY     PROSTATE BIOPSY     SQUAMOUS CELL CARCINOMA EXCISION     Skin cancer   TONSILLECTOMY     UMBILICAL HERNIA REPAIR  1988   UPPER GASTROINTESTINAL ENDOSCOPY      FAMILY HISTORY: family history includes Allergic rhinitis in his daughter; Anxiety disorder in his sister; COPD in his mother; Clotting disorder in his son; Coronary artery disease in his mother; Diabetes in his mother; Heart attack in his paternal grandfather; Heart failure in his maternal grandmother; Lung cancer in his father; Other in his father; Pneumonia (age of onset: 83) in his father; Stomach cancer in his paternal grandmother; Stroke in his maternal grandfather and paternal grandfather; Uterine cancer in his maternal grandmother.  SOCIAL HISTORY:  reports that he has never smoked. He has never used smokeless tobacco. He reports that he does not currently use alcohol. He reports that he does not use drugs.  ALLERGIES: Lovastatin, Ondansetron hcl, Penicillins, Pravastatin, Simvastatin, and Sulfa antibiotics  MEDICATIONS:  Current Outpatient Medications  Medication Sig Dispense Refill   ezetimibe (ZETIA) 10 MG tablet Take 10 mg by mouth daily.     finasteride (PROSCAR) 5 MG tablet Take 1 tablet by mouth daily.     fluticasone (FLONASE) 50 MCG/ACT nasal spray Place 1 spray into both nostrils daily as needed.     ibuprofen (ADVIL,MOTRIN) 200 MG tablet Take 200 mg by mouth. As needed     metoprolol succinate (TOPROL-XL) 25 MG 24 hr tablet Take 25 mg by mouth daily.     Psyllium Husk POWD Mix 1 tbsp in water every night and drink  0   rosuvastatin (CRESTOR) 10 MG tablet Take 10 mg by mouth 2 (two) times  a week.     No current facility-administered medications for this encounter.    REVIEW OF SYSTEMS:  On review of systems, the patient reports that he is doing well overall. He denies any chest pain, shortness of breath, cough, fevers, chills, night sweats, unintended weight changes. He denies any bowel disturbances, and denies abdominal pain, nausea or vomiting. He denies any new musculoskeletal or joint aches or pains. His IPSS was 17, indicating moderate urinary symptoms. He did not complete the SHIM questionnaire as he is not currently sexually active, having recently lost his wife in March 2022 and still grieving.  A complete review of systems is obtained and is otherwise negative.   PHYSICAL EXAM:  Wt Readings from Last 3 Encounters:  07/20/21 263 lb (119.3 kg)  07/06/21 263 lb (119.3 kg)  07/07/20 263 lb (119.3 kg)   Temp Readings from Last 3 Encounters:  07/20/21 (!) 97.3 F (36.3 C)  07/07/20 (!) 97.5 F (36.4 C)  06/12/18 (!) 97.3 F (36.3 C)   BP Readings from Last 3 Encounters:  07/20/21 114/61  07/07/20 110/68  06/04/19 128/78   Pulse Readings from Last 3 Encounters:  07/20/21 (!) 54  07/07/20 (!) 47  06/04/19 62    /10  In general this is a well appearing Caucasian male in no acute distress.  He's alert and oriented x4 and appropriate throughout the examination. Cardiopulmonary assessment is negative for acute distress and he exhibits normal effort.   KPS = 90  100 - Normal; no complaints; no evidence of disease. 90   - Able to carry on normal activity; minor signs or symptoms of disease. 80   - Normal activity with effort; some signs or symptoms of disease. 78   - Cares for self; unable to carry on normal activity or to do active work. 60   - Requires occasional assistance, but is able to care for most of his personal needs. 50   - Requires considerable assistance and frequent medical care. 70   - Disabled; requires special care and assistance. 11   -  Severely disabled; hospital admission is indicated although death not imminent. 69   - Very sick; hospital admission necessary; active supportive treatment necessary. 10   - Moribund; fatal processes progressing rapidly. 0     - Dead  Karnofsky DA, Abelmann [place], Craver LS and Burchenal [facility] [phone]) The use of the nitrogen mustards in the palliative treatment of carcinoma: with particular reference to bronchogenic carcinoma Cancer 1 634-56   LABORATORY DATA:  Lab Results  Component Value Date   WBC 9.6 02/19/2011   HGB 14.0 02/19/2011   HCT 40.7 02/19/2011   MCV 93.6 02/19/2011   PLT 314.0 02/19/2011   Lab Results  Component Value Date   NA 135 02/19/2011   K 4.0 02/19/2011   CL 100 02/19/2011   CO2 27 02/19/2011   No results found for: ALT, AST, GGT, ALKPHOS, BILITOT   RADIOGRAPHY: No results found.    IMPRESSION/PLAN: 76 y.o. gentleman with stage T1c adenocarcinoma of the prostate with a Gleason's score of 4+3 and a PSA of 4.8 (adjusted for finasteride).    We discussed the patient's workup and outlined the nature of prostate cancer in this setting. The patient's T stage, Gleason's score, and PSA put him into the unfavorable intermediate risk group. Accordingly, he is eligible for a variety of potential treatment options including brachytherapy, 5.5 weeks of external radiation, or prostatectomy. We discussed the available radiation techniques, and focused on the details and logistics of delivery. The patient may not be an ideal candidate for brachytherapy with persistent, moderate to severe LUTS and a prostate volume of 66.2 despite being on finasteride for many years. We discussed and outlined the risks, benefits, short and long-term effects associated with radiotherapy and compared and contrasted these with prostatectomy. We discussed the role of SpaceOAR gel in reducing the rectal toxicity associated with radiotherapy. He was encouraged to ask questions that were answered to his/their  stated satisfaction.  At the end of the conversation the patient is interested in moving forward with 5.5 weeks of external beam therapy. We will share our discussion with Dr. Alinda Money and make arrangements for fiducial markers and SpaceOAR gel placement, first available, prior to simulation, to reduce rectal toxicity from radiotherapy. The patient appears to have a good understanding of his disease and our treatment recommendations which are of curative intent and is in agreement with the stated plan.  Therefore, we will move forward with treatment planning accordingly, in anticipation of beginning IMRT in the near future.  We personally spent 60 minutes in this encounter including chart review, reviewing radiological studies, meeting face-to-face with the patient, entering orders and completing documentation.    Nicholos Johns, PA-C    Tyler Pita, MD  [facility]  Dial: [phone]   Fax: [phone] [email or web address]   Skype   LinkedIn   This document serves as a record of services personally performed by Tyler Pita, MD and Freeman Caldron, PA-C. It was created on their behalf by Wilburn Mylar, a trained medical scribe. The creation of this record is based on the scribe's personal observations and the provider's statements to them. This document has been checked and approved by the attending provider.

## 2021-10-12 ENCOUNTER — Encounter: Payer: Self-pay | Admitting: General Practice

## 2021-10-12 NOTE — Progress Notes (Signed)
[facility] Spiritual Care Note  Received return phone call from Ronald Edwards, who used the opportunity well to share and process his grief at the death of his beloved wife Ronald Edwards in March 2022. He reports keeping busy and keeping in touch with family as two key sources of support and meaning-making.  Ronald Edwards is open to [facility] grief support and plans to call tomorrow to see if they have a counseling spot for him yet, as he has been on the waiting list. He also plans to phone [facility] chaplain once his radiation is scheduled to set up a follow-up in-office appointment.   [place], [place], [facility] Pager [phone] Voicemail [phone]

## 2021-10-12 NOTE — Progress Notes (Signed)
[facility] Spiritual Care Note  Referred by Prostate Navigator Becky Sax for emotional and grief support. Left voicemail encouraging return call.   [place], [place], [facility] Pager [phone] Voicemail [phone]

## 2021-10-21 ENCOUNTER — Other Ambulatory Visit: Payer: Self-pay | Admitting: Urology

## 2021-10-23 NOTE — Progress Notes (Signed)
Patient chart reviewed for 12-25-2021 surgery, pt meets [facility] guidelines barring any acute status changes.

## 2021-12-10 NOTE — Progress Notes (Signed)
RN returned call from patient.  Voicemail left for call back regarding questions about upcoming treatment.  ?

## 2021-12-15 ENCOUNTER — Encounter: Payer: Self-pay | Admitting: General Practice

## 2021-12-15 NOTE — Progress Notes (Signed)
[place] Spiritual Care Note ? ?Attempted to reach out to Mr Girgenti by phone for pastoral check-in. Left voicemail of support, encouraging return call. ? ? ?Chaplain Lorrin Jackson, MDiv, [facility] ?Pager [phone] ?Voicemail [phone]  ?

## 2021-12-21 ENCOUNTER — Encounter: Payer: Self-pay | Admitting: General Practice

## 2021-12-21 NOTE — Progress Notes (Signed)
[facility] Spiritual Care Note ? ?Connected with Mr Mcree by phone, providing empathic listening and emotional support as he shared about his grief about his beloved wife Gloria's death a year ago 2022-11-30. After planning for retirement together, he is trying to find a new normal for meaning-making and enjoyment in the midst of this double loss (the losses of both his wife and their plans). We plan to schedule an appointment in my office once his treatment schedule comes together; he will call when ready. ? ? ?Chaplain Lorrin Jackson, MDiv, [facility] ?Pager [phone] ?Voicemail [phone]  ?

## 2021-12-23 ENCOUNTER — Other Ambulatory Visit: Payer: Self-pay | Admitting: Urology

## 2021-12-23 ENCOUNTER — Encounter (HOSPITAL_BASED_OUTPATIENT_CLINIC_OR_DEPARTMENT_OTHER): Payer: Self-pay | Admitting: Urology

## 2021-12-23 ENCOUNTER — Other Ambulatory Visit: Payer: Self-pay

## 2021-12-23 DIAGNOSIS — G629 Polyneuropathy, unspecified: Secondary | ICD-10-CM

## 2021-12-23 DIAGNOSIS — M1990 Unspecified osteoarthritis, unspecified site: Secondary | ICD-10-CM

## 2021-12-23 DIAGNOSIS — T7840XA Allergy, unspecified, initial encounter: Secondary | ICD-10-CM

## 2021-12-23 DIAGNOSIS — M5432 Sciatica, left side: Secondary | ICD-10-CM

## 2021-12-23 DIAGNOSIS — K429 Umbilical hernia without obstruction or gangrene: Secondary | ICD-10-CM

## 2021-12-23 HISTORY — DX: Allergy, unspecified, initial encounter: T78.40XA

## 2021-12-23 HISTORY — DX: Polyneuropathy, unspecified: G62.9

## 2021-12-23 HISTORY — DX: Sciatica, left side: M54.32

## 2021-12-23 HISTORY — DX: Umbilical hernia without obstruction or gangrene: K42.9

## 2021-12-23 HISTORY — DX: Unspecified osteoarthritis, unspecified site: M19.90

## 2021-12-23 NOTE — Progress Notes (Signed)
Spoke w/ via phone for pre-op interview---patient ?Lab needs dos---- I stat, ekg  per anesthesia, surgery orders need 2nd sign            ?Lab results------see below ?COVID test -----patient states asymptomatic no test needed ?Arrive at -------630 am 12-25-2021 ?NPO after MN NO Solid Food.  Clear liquids from MN until---530 am ?Med rec completed ?Medications to take morning of surgery ----- Finasteride, Metoprolol Succinate ?Diabetic medication -----n/a ?Patient instructed no nail polish to be worn day of surgery ?Patient instructed to bring photo id and insurance card day of surgery ?Patient aware to have Driver (ride ) / caregiver  sister Arta Bruce will stay cell [phone]   for 24 hours after surgery  ?Patient Special Instructions ----- fleets enema hs night before surgery ?Pre-Op special Istructions -----none ?Patient verbalized understanding of instructions that were given at this phone interview. ?Patient denies shortness of breath, chest pain, fever, cough at this phone interview.  ? ?Pcp dr Collier [place]ina blomgren lov  ?Oncology dr Christin Fudge 10-09-2021 epic  ?Pet scan 10-08-2021 epic ?Cardiology lov dr will camnitz 06-04-2019 f/u prn worked up for syncopy ?Holter monitor report  04-29-2019 epic ?Vas US carotid 01-20-2016 epic ?Echo 12-09-2010 epic ?Lov pcp  dr p blomgren 02-16-2021 on chart ?Last ekg pcp dr p blomgren 12-26-2020 on chart ? ? ?

## 2021-12-25 ENCOUNTER — Encounter (HOSPITAL_BASED_OUTPATIENT_CLINIC_OR_DEPARTMENT_OTHER): Payer: Self-pay | Admitting: Urology

## 2021-12-25 ENCOUNTER — Telehealth: Payer: Self-pay | Admitting: *Deleted

## 2021-12-25 ENCOUNTER — Ambulatory Visit (HOSPITAL_BASED_OUTPATIENT_CLINIC_OR_DEPARTMENT_OTHER)
Admission: RE | Admit: 2021-12-25 | Discharge: 2021-12-25 | Disposition: A | Discharge status: Home / Self Care | Payer: Medicare Other | Source: Ambulatory Visit | Attending: Urology | Admitting: Urology

## 2021-12-25 ENCOUNTER — Encounter (HOSPITAL_BASED_OUTPATIENT_CLINIC_OR_DEPARTMENT_OTHER)
Admission: RE | Disposition: A | Discharge status: Home / Self Care | Payer: Self-pay | Source: Ambulatory Visit | Attending: Urology

## 2021-12-25 ENCOUNTER — Ambulatory Visit (HOSPITAL_BASED_OUTPATIENT_CLINIC_OR_DEPARTMENT_OTHER): Payer: Medicare Other | Admitting: Anesthesiology

## 2021-12-25 DIAGNOSIS — E785 Hyperlipidemia, unspecified: Secondary | ICD-10-CM | POA: Diagnosis not present

## 2021-12-25 DIAGNOSIS — I10 Essential (primary) hypertension: Secondary | ICD-10-CM | POA: Insufficient documentation

## 2021-12-25 DIAGNOSIS — N401 Enlarged prostate with lower urinary tract symptoms: Secondary | ICD-10-CM | POA: Insufficient documentation

## 2021-12-25 DIAGNOSIS — M1990 Unspecified osteoarthritis, unspecified site: Secondary | ICD-10-CM | POA: Diagnosis not present

## 2021-12-25 DIAGNOSIS — C61 Malignant neoplasm of prostate: Secondary | ICD-10-CM | POA: Diagnosis present

## 2021-12-25 DIAGNOSIS — Z79899 Other long term (current) drug therapy: Secondary | ICD-10-CM | POA: Insufficient documentation

## 2021-12-25 DIAGNOSIS — G709 Myoneural disorder, unspecified: Secondary | ICD-10-CM | POA: Diagnosis not present

## 2021-12-25 DIAGNOSIS — R351 Nocturia: Secondary | ICD-10-CM | POA: Insufficient documentation

## 2021-12-25 HISTORY — PX: GOLD SEED IMPLANT: SHX6343

## 2021-12-25 HISTORY — DX: Anxiety disorder, unspecified: F41.9

## 2021-12-25 HISTORY — DX: Presence of spectacles and contact lenses: Z97.3

## 2021-12-25 HISTORY — DX: Fracture of unspecified carpal bone, left wrist, initial encounter for closed fracture: S62.102A

## 2021-12-25 HISTORY — DX: Unspecified hearing loss, unspecified ear: H91.90

## 2021-12-25 HISTORY — PX: SPACE OAR INSTILLATION: SHX6769

## 2021-12-25 LAB — POCT I-STAT, CHEM 8
BUN: 16 mg/dL (ref 8–23)
Calcium, Ion: 1.28 mmol/L (ref 1.15–1.40)
Chloride: 102 mmol/L (ref 98–111)
Creatinine, Ser: 1.1 mg/dL (ref 0.61–1.24)
Glucose, Bld: 97 mg/dL (ref 70–99)
HCT: 47 % (ref 39.0–52.0)
Hemoglobin: 16 g/dL (ref 13.0–17.0)
Potassium: 4.3 mmol/L (ref 3.5–5.1)
Sodium: 138 mmol/L (ref 135–145)
TCO2: 25 mmol/L (ref 22–32)

## 2021-12-25 SURGERY — INSERTION, GOLD SEEDS
Anesthesia: Monitor Anesthesia Care | Site: Prostate

## 2021-12-25 MED ORDER — MIDAZOLAM HCL 2 MG/2ML IJ SOLN
INTRAMUSCULAR | Status: AC
  Filled 2021-12-25: qty 2

## 2021-12-25 MED ORDER — FENTANYL CITRATE (PF) 100 MCG/2ML IJ SOLN
INTRAMUSCULAR | Status: DC | PRN
  Administered 2021-12-25: 50 ug via INTRAVENOUS

## 2021-12-25 MED ORDER — EPHEDRINE SULFATE-NACL 50-0.9 MG/10ML-% IV SOSY
PREFILLED_SYRINGE | INTRAVENOUS | Status: DC | PRN
  Administered 2021-12-25: 10 mg via INTRAVENOUS

## 2021-12-25 MED ORDER — MIDAZOLAM HCL 5 MG/5ML IJ SOLN
INTRAMUSCULAR | Status: DC | PRN
  Administered 2021-12-25: 1 mg via INTRAVENOUS

## 2021-12-25 MED ORDER — CIPROFLOXACIN IN D5W 400 MG/200ML IV SOLN
INTRAVENOUS | Status: AC
  Filled 2021-12-25: qty 200

## 2021-12-25 MED ORDER — PROPOFOL 10 MG/ML IV BOLUS
INTRAVENOUS | Status: DC | PRN
  Administered 2021-12-25: 10 mg via INTRAVENOUS

## 2021-12-25 MED ORDER — LACTATED RINGERS IV SOLN: INTRAVENOUS | Status: DC

## 2021-12-25 MED ORDER — LIDOCAINE HCL 1 % IJ SOLN
INTRAMUSCULAR | Status: DC | PRN
  Administered 2021-12-25: 10 mL

## 2021-12-25 MED ORDER — PROPOFOL 500 MG/50ML IV EMUL
INTRAVENOUS | Status: DC | PRN
  Administered 2021-12-25: 200 ug/kg/min via INTRAVENOUS

## 2021-12-25 MED ORDER — CIPROFLOXACIN IN D5W 400 MG/200ML IV SOLN
INTRAVENOUS | Status: DC | PRN
Start: 2021-12-25 — End: 2021-12-25
  Administered 2021-12-25: 400 mg via INTRAVENOUS

## 2021-12-25 MED ORDER — FLEET ENEMA 7-19 GM/118ML RE ENEM: 1.0000 | ENEMA | Freq: Once | RECTAL | Status: DC

## 2021-12-25 MED ORDER — ACETAMINOPHEN 500 MG PO TABS
ORAL_TABLET | ORAL | Status: AC
  Filled 2021-12-25: qty 2

## 2021-12-25 MED ORDER — FENTANYL CITRATE (PF) 100 MCG/2ML IJ SOLN
INTRAMUSCULAR | Status: AC
  Filled 2021-12-25: qty 2

## 2021-12-25 MED ORDER — EPHEDRINE 5 MG/ML INJ
INTRAVENOUS | Status: AC
  Filled 2021-12-25: qty 5

## 2021-12-25 MED ORDER — FENTANYL CITRATE (PF) 100 MCG/2ML IJ SOLN: 25.0000 ug | INTRAMUSCULAR | Status: DC | PRN

## 2021-12-25 MED ORDER — SODIUM CHLORIDE (PF) 0.9 % IJ SOLN
INTRAMUSCULAR | Status: DC | PRN
  Administered 2021-12-25: 10 mL via INTRAVENOUS

## 2021-12-25 MED ORDER — ACETAMINOPHEN 500 MG PO TABS
1000.0000 mg | ORAL_TABLET | Freq: Once | ORAL | Status: AC
Start: 2021-12-25 — End: 2021-12-25
  Administered 2021-12-25: 1000 mg via ORAL

## 2021-12-25 MED ORDER — AMISULPRIDE (ANTIEMETIC) 5 MG/2ML IV SOLN: 10.0000 mg | Freq: Once | INTRAVENOUS | Status: DC | PRN

## 2021-12-25 SURGICAL SUPPLY — 26 items
BLADE CLIPPER SENSICLIP SURGIC (BLADE) ×2 IMPLANT
CNTNR URN SCR LID CUP LEK RST (MISCELLANEOUS) ×2 IMPLANT
CONT SPEC 4OZ STRL OR WHT (MISCELLANEOUS) ×2
COVER BACK TABLE 60X90IN (DRAPES) ×3 IMPLANT
DRSG IV TEGADERM 3.5X4.5 STRL (GAUZE/BANDAGES/DRESSINGS) ×1 IMPLANT
DRSG TEGADERM 4X4.75 (GAUZE/BANDAGES/DRESSINGS) ×3 IMPLANT
DRSG TEGADERM 8X12 (GAUZE/BANDAGES/DRESSINGS) ×3 IMPLANT
GAUZE SPONGE 4X4 12PLY STRL (GAUZE/BANDAGES/DRESSINGS) ×3 IMPLANT
GAUZE SPONGE 4X4 8PLY NS (GAUZE/BANDAGES/DRESSINGS) ×1 IMPLANT
GLOVE BIO SURGEON STRL SZ7.5 (GLOVE) ×3 IMPLANT
GLOVE SURG ORTHO 8.5 STRL (GLOVE) ×3 IMPLANT
IMPL SPACEOAR VUE SYSTEM (Spacer) ×2 IMPLANT
IMPLANT SPACEOAR VUE SYSTEM (Spacer) ×2 IMPLANT
KIT TURNOVER CYSTO (KITS) ×3 IMPLANT
MARKER GOLD PRELOAD 1.2X3 (Urological Implant) ×2 IMPLANT
MARKER SKIN DUAL TIP RULER LAB (MISCELLANEOUS) ×3 IMPLANT
NDL SPNL 22GX3.5 QUINCKE BK (NEEDLE) ×2 IMPLANT
NEEDLE SPNL 22GX3.5 QUINCKE BK (NEEDLE) ×2 IMPLANT
SEED GOLD PRELOAD 1.2X3 (Urological Implant) ×2 IMPLANT
SHEATH ULTRASOUND LF (SHEATH) IMPLANT
SHEATH ULTRASOUND LTX NONSTRL (SHEATH) ×1 IMPLANT
SURGILUBE 2OZ TUBE FLIPTOP (MISCELLANEOUS) ×3 IMPLANT
SYR 10ML LL (SYRINGE) ×3 IMPLANT
SYR CONTROL 10ML LL (SYRINGE) ×3 IMPLANT
TOWEL OR 17X26 10 PK STRL BLUE (TOWEL DISPOSABLE) ×3 IMPLANT
UNDERPAD 30X36 HEAVY ABSORB (UNDERPADS AND DIAPERS) ×3 IMPLANT

## 2021-12-25 NOTE — Op Note (Signed)
Preoperative diagnosis: Clinically localized adenocarcinoma of the prostate  ? ?Postoperative diagnosis: Clinically localized adenocarcinoma of the prostate ? ?Procedure: 1) Placement of fiducial markers into prostate ?                   2) Insertion of SpaceOAR hydrogel  ? ?Surgeon: Louis Meckel, M.D. ? ?Anesthesia: General ? ?EBL: Minimal ? ?Complications: None ? ?Indication: Ronald Edwards is a 76 y.o. gentleman with clinically localized prostate cancer. After discussing management options for treatment, he elected to proceed with radiotherapy. He presents today for the above procedures. The potential risks, complications, alternative options, and expected recovery course have been discussed in detail with the patient and he has provided informed consent to proceed. ? ?Description of procedure: The patient was administered preoperative antibiotics, placed in the dorsal lithotomy position, and prepped and draped in the usual sterile fashion. Next, transrectal ultrasonography was utilized to visualize the prostate. Three gold fiducial markers were then placed into the prostate via transperineal needles under ultrasound guidance at the right apex, right base, and left mid gland under direct ultrasound guidance. A site in the midline was then selected on the perineum for placement of an 18 g needle with saline. The needle was advanced above the rectum and below Denonvillier's fascia to the mid gland and confirmed to be in the midline on transverse imaging. One cc of saline was injected confirming appropriate expansion of this space. A total of 5 cc of saline was then injected to open the space further bilaterally. The saline syringe was then removed and the SpaceOAR hydrogel was injected with good distribution bilaterally. He tolerated the procedure well and without complications. He was given a voiding trial prior to discharge from the PACU.  ?

## 2021-12-25 NOTE — Discharge Instructions (Addendum)
For several days the patient: ? ?should increase his fluid intake and limit strenuous activity. ?he might have mild discomfort at the base of his penis or in his rectum. ?he might have blood in his urine or blood in his bowel movements. ? ?For 2-3 months he might have blood in his ejaculate (semen). ? ?Call the office immedicately: ? for blood clots in the urine or bowel movements,  ?difficulty urinating,  ?inability to urinate,  ?urinary retention,  ?painful or frequent urination,  ?fever, chills,  ?nausea, vomiting, ?other illness.  ? ?[facility]:  [phone]  ?Post Anesthesia Home Care Instructions ? ?Activity: ?Get plenty of rest for the remainder of the day. A responsible adult should stay with you for 24 hours following the procedure.  ?For the next 24 hours, DO NOT: ?-Drive a car ?-Paediatric nurse ?-Drink alcoholic beverages ?-Take any medication unless instructed by your physician ?-Make any legal decisions or sign important papers. ? ?Meals: ?Start with liquid foods such as gelatin or soup. Progress to regular foods as tolerated. Avoid greasy, spicy, heavy foods. If nausea and/or vomiting occur, drink only clear liquids until the nausea and/or vomiting subsides. Call your physician if vomiting continues. ? ?Special Instructions/Symptoms: ?Your throat may feel dry or sore from the anesthesia or the breathing tube placed in your throat during surgery. If this causes discomfort, gargle with warm salt water. The discomfort should disappear within 24 hours. ? ? ?   ?

## 2021-12-25 NOTE — Telephone Encounter (Signed)
RETURNED PATIENT'S PHONE CALL, NO ANSWER, UNABLE TO LEAVE MESSAGE DUE TO VM NOT BEING SET UP ?

## 2021-12-25 NOTE — Interval H&P Note (Signed)
History and Physical Interval Note: ? ?12/25/2021 ?8:00 AM ? ?Ronald Edwards  has presented today for surgery, with the diagnosis of PROSTATE CANCER.  The various methods of treatment have been discussed with the patient and family. After consideration of risks, benefits and other options for treatment, the patient has consented to  Procedure(s) with comments: ?GOLD SEED IMPLANT (N/A) - ONLY NEEDS 30 MINS ?SPACE OAR INSTILLATION (N/A) as a surgical intervention.  The patient's history has been reviewed, patient examined, no change in status, stable for surgery.  I have reviewed the patient's chart and labs.  Questions were answered to the patient's satisfaction.   ? ? ?Ardis Hughs ? ? ?

## 2021-12-25 NOTE — H&P (Signed)
CC: Prostate Cancer  ? ?PCP: Dr. Derinda Late  ?Location of consult: [facility]  ? ?Ronald Edwards is a 76 year old gentleman with a past medical history of hyperlipidemia, hypertension, and BPH. He was initially diagnosed with very low risk prostate cancer in January 2010. At that time, his PSA was 6.28 and his initial biopsy on 09/03/08 indicated 2 cores of Gleason 3+3=6 adenocarcinoma with 5% and 10% involvement. After discussion, he elected active surveillance. He was started on Avodart at that time by his urologist, Dr. Rosana Hoes. He subsequently transferred his care to me after Dr. Rosana Hoes left [place]. Subsequent surveillance biopsies in July 2010, January 2012, February 2014, and March 2016. An MRI of the prostate in May 2018 was performed and did not demonstrate concerning lesions. He was diagnosed with a PMS2 mutation c/w Lynch syndrome in 2019. His PSA has remained consistent since starting 5 alpha reductase inhibitor therapy with his most recent PSA having been 2.41 in November 2022. He has remained on both finasteride 5 mg and tamsulosin 0.4 mg for many years now with adequate control of his LUTS. He had been taking care of his wife for a few years who was quite debilitated before her death and we delayed any further invasive procedures for him during that time. However, on 09/09/21 he did finally proceed with another TRUS biopsy of the prostate and this indicated Gleason 4+3=7 adenocarcinoma with 3 out of 12 biopsy cores positive but only one core with upgraded disease. Only 10% of that core was positive and 60% was pattern 4.  ? ?Family history: None.  ? ?Imaging studies: PSMA PET (10/08/21): Negative for metastatic disease.  ? ?PMH: He has a history of hyperlipidemia, hypertension, and BPH.  ?PSH: No abdominal surgeries.  ? ?TNM stage: cT1c Nx Mx  ?PSA: 2.41  ?Gleason score: 4+3=7 (GG 3)  ?Biopsy (09/09/21): 3/12 cores positive  ?Left: L base (10%,  4+3=7), L lateral base (10%, 3+3=6)  ?Right: R apex (10%, 3+3=6)  ?Prostate volume: 66.2 cc  ?PSAD: 0.07  ? ? ?Urinary function: IPSS is 17.  ?Erectile function: SHIM score is N/A.  ? ?  ?ALLERGIES: Penicillins ?  ? ?MEDICATIONS: Crestor 10 mg tablet  ?Finasteride 5 mg tablet  ?Levofloxacin 750 mg tablet Please take one tablet the morning of your biopsy.  ?Tamsulosin Hcl 0.4 mg capsule  ?Fluticasone Propionate 50 mcg/actuation spray, suspension 0 Nasal  ?Metamucil  ?Metoprolol Succinate 25 mg tablet, extended release 24 hr Oral  ?Zetia 10 mg tablet  ?  ? ?GU PSH: Biopsy Skin Lesion - 2014 ?Prostate Needle Biopsy - 09/09/2021 ? ?  ?   ?PSH Notes: Biopsy Skin  ? ?NON-GU PSH: Surgical Pathology, Gross And Microscopic Examination For Prostate Needle - 09/09/2021 ? ?  ? ?GU PMH: Prostate Cancer - 09/09/2021, - 07/14/2021, - 09/30/2020, Prostate cancer, - 2017 ?BPH w/LUTS - 07/14/2021, - 09/30/2020, Benign prostatic hyperplasia with urinary obstruction, - 2017 ?Nocturia - 07/14/2021, - 09/30/2020, - 2018 ?  ?   ?PMH Notes:  ? ?1) Prostate cancer: He was initially diagnosed with prostate cancer in January 2010 when he was diagnosed with clinical stage T1cNxMx, Gleason 3+3=6 adenocarcinoma in 2 foci of the prostate. After discussing management options, he elected to proceed with active surveillance. He began Avodart under Dr. Rosana Hoes' care shortly after his diagnosis. He was noted to have a PMS2 mutation c/w Lynch Syndrome in 2019.  ? ?Initial diagnosis: January 2010  ?TNM stage:  cT1c Nx Mx  ?PSA at diagnosis: 6.28  ?Gleason score: 3+3=6  ?Biopsy (09/03/08): 2/12 cores -- L mid (10%), L base (5%), Vol 96 cc  ? ?Surveillance:  ?Jul 2010: 2/12 cores -- L lat mid (2%), L base (5%), Vol 71.8 cc  ?Jan 2012: 1/20 cores -- L mid (5%), Vol 57.2 cc  ?Feb 2014: R base (HGPIN), L lateral apex (atypia), Vol 60.2 cc  ?Mar 2016: 12 cores biopsy - 1/12 cores -- L lateral mid (5%, Vol 54.8 cc)  ?May 2018: MRI - No suspicious lesions for high grade  disease  ?Aug 2019: Diagnosed with PMS2 mutation c/w Lynch Syndrome  ? ?2) BPH/LUTS: He is treated with Avodart which he began in early 2010. This was changed to finasteride in 2017 for cost purposes.  ? ?Current treatment: Finasteride 5 mg  ?Prior treatment: Tamsulosin (he had taken this intermittently but developed related syncopal episodes and was instructed to stop this medication)  ?  ? ?NON-GU PMH: Arthritis ?Hypercholesterolemia ?Skin Cancer, History ?  ? ?FAMILY HISTORY: Diabetes - Mother  ? ?SOCIAL HISTORY: Marital Status: Married ?Preferred Language: [place]; Ethnicity: Not Hispanic Or Latino; Race: White ?Current Smoking Status: Patient has never smoked.  ?Social Drinker.  ?Drinks 1 caffeinated drink per day. ?  ?  Notes: Never A Smoker, Occupation:, Alcohol Use, Marital History - Currently Married  ? ?REVIEW OF SYSTEMS:    ?GU Review Male:   Patient denies frequent urination, hard to postpone urination, burning/ pain with urination, get up at night to urinate, leakage of urine, stream starts and stops, trouble starting your streams, and have to strain to urinate .  ?Gastrointestinal (Lower):   Patient denies diarrhea and constipation.  ?Gastrointestinal (Upper):   Patient denies nausea and vomiting.  ?Constitutional:   Patient denies fever, weight loss, fatigue, and night sweats.  ?Skin:   Patient denies skin rash/ lesion and itching.  ?Eyes:   Patient denies blurred vision and double vision.  ?Ears/ Nose/ Throat:   Patient denies sore throat and sinus problems.  ?Hematologic/Lymphatic:   Patient denies swollen glands and easy bruising.  ?Cardiovascular:   Patient denies leg swelling and chest pains.  ?Respiratory:   Patient denies cough and shortness of breath.  ?Endocrine:   Patient denies excessive thirst.  ?Musculoskeletal:   Patient denies back pain and joint pain.  ?Neurological:   Patient denies headaches and dizziness.  ?Psychologic:   Patient denies depression and anxiety.  ? ?VITAL SIGNS: None   ? ?MULTI-SYSTEM PHYSICAL EXAMINATION:    ?Constitutional: Well-nourished. No physical deformities. Normally developed. Good grooming.  ? ?  ?Complexity of Data:  ?Lab Test Review:   PSA  ?Records Review:   Pathology Reports, Previous Patient Records  ?X-Ray Review: PET Scan: Reviewed Films.  ?  ? 07/08/21 09/24/20 02/29/20 08/29/19 02/16/19 08/02/18 12/29/17 06/06/17  ?PSA  ?Total PSA 2.41 ng/mL 2.10 ng/mL 1.95 ng/mL 2.65 ng/mL 2.32 ng/mL 2.21 ng/mL 1.60 ng/mL 1.89 ng/mL  ? ?Notes:                     CLINICAL DATA: Prostate cancer diagnosed 15 years free air. Recent  ?prostate cancer biopsy positive for 3+3 and 3+4 prostate  ?adenocarcinoma.  ? ?EXAM:  ?NUCLEAR MEDICINE PET SKULL BASE TO THIGH  ? ?TECHNIQUE:  ?9.1 mCi F18 Piflufolastat (Pylarify) was injected intravenously.  ?Full-ring PET imaging was performed from the skull base to thigh  ?after the radiotracer. CT data was obtained and used for attenuation  ?  correction and anatomic localization.  ? ?COMPARISON: None.  ? ?FINDINGS:  ?NECK  ? ?No radiotracer avid lymph nodes in the neck.  ? ?Incidental CT finding: None  ? ?CHEST  ? ?No radiotracer accumulation within mediastinal or hilar lymph nodes.  ?No suspicious pulmonary nodules on the CT scan.  ? ?Incidental CT finding: None  ? ?ABDOMEN/PELVIS  ? ?Prostate: Focal radiotracer activity in posterior LEFT prostate  ?gland towards the base with SUV max equal 8.9. This is small region  ?estimated at 5 to 10 mm in size (image 42). Heterogeneous activity  ?elsewhere within the prostate gland.  ? ?Lymph nodes: No abnormal radiotracer accumulation within pelvic or  ?abdominal nodes.  ? ?Liver: No evidence of liver metastasis  ? ?Incidental CT finding: LEFT colon diverticulosis. Bilateral fat  ?filled inguinal hernias.  ? ?SKELETON  ? ?No focal activity to suggest skeletal metastasis.  ? ?IMPRESSION:  ?1. Focal radiotracer activity at the posterior LEFT base concerning  ?for local prostate adenocarcinoma recurrence.   ?2. No evidence metastatic adenopathy in the pelvis or periaortic  ?retroperitoneum.  ?3. No evidence of visceral metastasis or skeletal metastasis.  ? ? ?Electronically Signed  ?By: Suzy Bouchard M.D.  ?On

## 2021-12-25 NOTE — Transfer of Care (Signed)
Immediate Anesthesia Transfer of Care Note ? ?Patient: Ronald Edwards ? ?Procedure(s) Performed: GOLD SEED IMPLANT (Prostate) ?SPACE OAR INSTILLATION (Prostate) ? ?Patient Location: PACU ? ?Anesthesia Type:MAC ? ?Level of Consciousness: awake, alert , oriented and patient cooperative ? ?Airway & Oxygen Therapy: Patient Spontanous Breathing and Patient connected to face mask oxygen ? ?Post-op Assessment: Report given to RN and Post -op Vital signs reviewed and stable ? ?Post vital signs: Reviewed and stable ? ?Last Vitals:  ?Vitals Value Taken Time  ?BP 110/55 12/25/21 0846  ?Temp    ?Pulse 61 12/25/21 0847  ?Resp 13 12/25/21 0847  ?SpO2 97 % 12/25/21 0847  ?Vitals shown include unvalidated device data. ? ?Last Pain:  ?Vitals:  ? 12/25/21 0710  ?TempSrc: Oral  ?PainSc: 0-No pain  ?   ? ?Patients Stated Pain Goal: 4 (12/25/21 0710) ? ?Complications: No notable events documented. ?

## 2021-12-25 NOTE — Telephone Encounter (Signed)
CALLED PATIENT TO REMIND OF SIM APPT. FOR 12-28-21- ARRIVAL TIME- 12:45 PM @ [place] [facility], INFORMED PATIENT TO ARRIVE WITH A FULL BLADDER AND AN EMPTY BOWEL ?

## 2021-12-25 NOTE — Anesthesia Preprocedure Evaluation (Addendum)
Anesthesia Evaluation  ?Patient identified by MRN, date of birth, ID band ?Patient awake ? ? ? ?Reviewed: ?Allergy & Precautions, NPO status , Patient's Chart, lab work & pertinent test results ? ?Airway ?Mallampati: II ? ?TM Distance: >3 FB ?Neck ROM: Full ? ? ? Dental ?  ?Pulmonary ?neg pulmonary ROS,  ?  ?Pulmonary exam normal ? ? ? ? ? ? ? Cardiovascular ?negative cardio ROS ?Normal cardiovascular exam ?Rhythm:Regular  ? ?  ?Neuro/Psych ? Headaches, PSYCHIATRIC DISORDERS Anxiety Depression  Neuromuscular disease   ? GI/Hepatic ?negative GI ROS, Neg liver ROS,   ?Endo/Other  ?negative endocrine ROS ? Renal/GU ?negative Renal ROS  ? ?  ?Musculoskeletal ? ?(+) Arthritis ,  ? Abdominal ?(+) + obese,   ?Peds ? Hematology ?negative hematology ROS ?(+)   ?Anesthesia Other Findings ?PROSTATE CANCER ? Reproductive/Obstetrics ? ?  ? ? ? ? ? ? ? ? ? ? ? ? ? ?  ?  ? ? ? ? ? ? ? ?Anesthesia Physical ?Anesthesia Plan ? ?ASA: 2 ? ?Anesthesia Plan: MAC  ? ?Post-op Pain Management:   ? ?Induction: Intravenous ? ?PONV Risk Score and Plan: 1 and Ondansetron, Dexamethasone, Propofol infusion and Treatment may vary due to age or medical condition ? ?Airway Management Planned: Simple Face Mask ? ?Additional Equipment:  ? ?Intra-op Plan:  ? ?Post-operative Plan:  ? ?Informed Consent: I have reviewed the patients History and Physical, chart, labs and discussed the procedure including the risks, benefits and alternatives for the proposed anesthesia with the patient or authorized representative who has indicated his/her understanding and acceptance.  ? ? ? ?Dental advisory given ? ?Plan Discussed with: CRNA ? ?Anesthesia Plan Comments:   ? ? ? ? ? ?Anesthesia Quick Evaluation ? ?

## 2021-12-25 NOTE — Anesthesia Postprocedure Evaluation (Signed)
Anesthesia Post Note ? ?Patient: Ronald Edwards ? ?Procedure(s) Performed: GOLD SEED IMPLANT (Prostate) ?SPACE OAR INSTILLATION (Prostate) ? ?  ? ?Patient location during evaluation: PACU ?Anesthesia Type: MAC ?Level of consciousness: awake ?Pain management: pain level controlled ?Vital Signs Assessment: post-procedure vital signs reviewed and stable ?Respiratory status: spontaneous breathing, nonlabored ventilation, respiratory function stable and patient connected to nasal cannula oxygen ?Cardiovascular status: stable and blood pressure returned to baseline ?Postop Assessment: no apparent nausea or vomiting ?Anesthetic complications: no ? ? ?No notable events documented. ? ?Last Vitals:  ?Vitals:  ? 12/25/21 0938 12/25/21 1105  ?BP:  (!) 131/59  ?Pulse: (!) 55 (!) 49  ?Resp: 16 18  ?Temp:  (!) 36.4 ?C  ?SpO2: 100% 100%  ?  ?Last Pain:  ?Vitals:  ? 12/25/21 1105  ?TempSrc:   ?PainSc: 0-No pain  ? ? ?  ?  ?  ?  ?  ?  ? ?Celestia Duva P Mina Babula ? ? ? ? ?

## 2021-12-28 ENCOUNTER — Encounter (HOSPITAL_BASED_OUTPATIENT_CLINIC_OR_DEPARTMENT_OTHER): Payer: Self-pay | Admitting: Urology

## 2021-12-28 ENCOUNTER — Ambulatory Visit
Admission: RE | Admit: 2021-12-28 | Discharge: 2021-12-28 | Disposition: A | Discharge status: Home / Self Care | Payer: Medicare Other | Source: Ambulatory Visit | Attending: Radiation Oncology | Admitting: Radiation Oncology

## 2021-12-28 ENCOUNTER — Other Ambulatory Visit: Payer: Self-pay

## 2021-12-28 DIAGNOSIS — C61 Malignant neoplasm of prostate: Secondary | ICD-10-CM | POA: Diagnosis present

## 2021-12-28 DIAGNOSIS — Z510 Encounter for antineoplastic radiation therapy: Secondary | ICD-10-CM | POA: Insufficient documentation

## 2021-12-29 NOTE — Progress Notes (Signed)
?  Radiation Oncology         [phone] ?________________________________ ? ?Name: Ronald Edwards MRN: [id number]  ?Date: 12/28/2021  DOB: July 26, 1946 ? ?SIMULATION AND TREATMENT PLANNING NOTE ? ?  ICD-10-CM   ?1. Adenocarcinoma of prostate ([place])  C61   ?  ? ? ?DIAGNOSIS:  76 y.o. gentleman with stage T1c adenocarcinoma of the prostate with a Gleason's score of 4+3 and a PSA of 4.8 (adjusted for finasteride) ? ?NARRATIVE:  The patient was brought to the [facility].  Identity was confirmed.  All relevant records and images related to the planned course of therapy were reviewed.  The patient freely provided informed written consent to proceed with treatment after reviewing the details related to the planned course of therapy. The consent form was witnessed and verified by the simulation staff.  Then, the patient was set-up in a stable reproducible supine position for radiation therapy.  A vacuum lock pillow device was custom fabricated to position his legs in a reproducible immobilized position.  Then, I performed a urethrogram under sterile conditions to identify the prostatic apex.  CT images were obtained.  Surface markings were placed.  The CT images were loaded into the planning software.  Then the prostate target and avoidance structures including the rectum, bladder, bowel and hips were contoured.  Treatment planning then occurred.  The radiation prescription was entered and confirmed.  A total of one complex treatment devices was fabricated. I have requested : Intensity Modulated Radiotherapy (IMRT) is medically necessary for this case for the following reason:  Rectal sparing.. ? ?PLAN:  The patient will receive 70 Gy in 28 fractions. ? ?________________________________ ? ?Sheral Apley Tammi Klippel, M.D. ? ?

## 2022-01-04 DIAGNOSIS — Z510 Encounter for antineoplastic radiation therapy: Secondary | ICD-10-CM | POA: Diagnosis not present

## 2022-01-06 ENCOUNTER — Other Ambulatory Visit: Payer: Self-pay

## 2022-01-06 ENCOUNTER — Ambulatory Visit
Admission: RE | Admit: 2022-01-06 | Discharge: 2022-01-06 | Disposition: A | Discharge status: Home / Self Care | Payer: Medicare Other | Source: Ambulatory Visit | Attending: Radiation Oncology | Admitting: Radiation Oncology

## 2022-01-06 DIAGNOSIS — Z510 Encounter for antineoplastic radiation therapy: Secondary | ICD-10-CM | POA: Diagnosis not present

## 2022-01-06 LAB — RAD ONC ARIA SESSION SUMMARY
Course Elapsed Days: 0
Plan Fractions Treated to Date: 1
Plan Prescribed Dose Per Fraction: 2.5 Gy
Plan Total Fractions Prescribed: 28
Plan Total Prescribed Dose: 70 Gy
Reference Point Dosage Given to Date: 2.5 Gy
Reference Point Session Dosage Given: 2.5 Gy
Session Number: 1

## 2022-01-07 ENCOUNTER — Ambulatory Visit
Admission: RE | Admit: 2022-01-07 | Discharge: 2022-01-07 | Disposition: A | Discharge status: Home / Self Care | Payer: Medicare Other | Source: Ambulatory Visit | Attending: Radiation Oncology | Admitting: Radiation Oncology

## 2022-01-07 ENCOUNTER — Other Ambulatory Visit: Payer: Self-pay

## 2022-01-07 DIAGNOSIS — Z510 Encounter for antineoplastic radiation therapy: Secondary | ICD-10-CM | POA: Diagnosis not present

## 2022-01-07 LAB — RAD ONC ARIA SESSION SUMMARY
Course Elapsed Days: 1
Plan Fractions Treated to Date: 2
Plan Prescribed Dose Per Fraction: 2.5 Gy
Plan Total Fractions Prescribed: 28
Plan Total Prescribed Dose: 70 Gy
Reference Point Dosage Given to Date: 5 Gy
Reference Point Session Dosage Given: 2.5 Gy
Session Number: 2

## 2022-01-08 ENCOUNTER — Other Ambulatory Visit: Payer: Self-pay

## 2022-01-08 ENCOUNTER — Ambulatory Visit
Admission: RE | Admit: 2022-01-08 | Discharge: 2022-01-08 | Disposition: A | Discharge status: Home / Self Care | Payer: Medicare Other | Source: Ambulatory Visit | Attending: Radiation Oncology | Admitting: Radiation Oncology

## 2022-01-08 DIAGNOSIS — Z510 Encounter for antineoplastic radiation therapy: Secondary | ICD-10-CM | POA: Diagnosis not present

## 2022-01-08 LAB — RAD ONC ARIA SESSION SUMMARY
Course Elapsed Days: 2
Plan Fractions Treated to Date: 3
Plan Prescribed Dose Per Fraction: 2.5 Gy
Plan Total Fractions Prescribed: 28
Plan Total Prescribed Dose: 70 Gy
Reference Point Dosage Given to Date: 7.5 Gy
Reference Point Session Dosage Given: 2.5 Gy
Session Number: 3

## 2022-01-11 ENCOUNTER — Ambulatory Visit
Admission: RE | Admit: 2022-01-11 | Discharge: 2022-01-11 | Disposition: A | Discharge status: Home / Self Care | Payer: Medicare Other | Source: Ambulatory Visit | Attending: Radiation Oncology | Admitting: Radiation Oncology

## 2022-01-11 ENCOUNTER — Other Ambulatory Visit: Payer: Self-pay

## 2022-01-11 DIAGNOSIS — Z510 Encounter for antineoplastic radiation therapy: Secondary | ICD-10-CM | POA: Diagnosis not present

## 2022-01-11 LAB — RAD ONC ARIA SESSION SUMMARY
Course Elapsed Days: 5
Plan Fractions Treated to Date: 4
Plan Prescribed Dose Per Fraction: 2.5 Gy
Plan Total Fractions Prescribed: 28
Plan Total Prescribed Dose: 70 Gy
Reference Point Dosage Given to Date: 10 Gy
Reference Point Session Dosage Given: 2.5 Gy
Session Number: 4

## 2022-01-12 ENCOUNTER — Ambulatory Visit
Admission: RE | Admit: 2022-01-12 | Discharge: 2022-01-12 | Disposition: A | Discharge status: Home / Self Care | Payer: Medicare Other | Source: Ambulatory Visit | Attending: Radiation Oncology | Admitting: Radiation Oncology

## 2022-01-12 ENCOUNTER — Other Ambulatory Visit: Payer: Self-pay

## 2022-01-12 ENCOUNTER — Ambulatory Visit: Payer: Medicare Other

## 2022-01-12 DIAGNOSIS — Z510 Encounter for antineoplastic radiation therapy: Secondary | ICD-10-CM | POA: Diagnosis not present

## 2022-01-12 LAB — RAD ONC ARIA SESSION SUMMARY
Course Elapsed Days: 6
Plan Fractions Treated to Date: 5
Plan Prescribed Dose Per Fraction: 2.5 Gy
Plan Total Fractions Prescribed: 28
Plan Total Prescribed Dose: 70 Gy
Reference Point Dosage Given to Date: 12.5 Gy
Reference Point Session Dosage Given: 2.5 Gy
Session Number: 5

## 2022-01-13 ENCOUNTER — Ambulatory Visit: Payer: Medicare Other

## 2022-01-13 ENCOUNTER — Ambulatory Visit
Admission: RE | Admit: 2022-01-13 | Discharge: 2022-01-13 | Disposition: A | Discharge status: Home / Self Care | Payer: Medicare Other | Source: Ambulatory Visit | Attending: Radiation Oncology | Admitting: Radiation Oncology

## 2022-01-13 ENCOUNTER — Other Ambulatory Visit: Payer: Self-pay

## 2022-01-13 DIAGNOSIS — Z510 Encounter for antineoplastic radiation therapy: Secondary | ICD-10-CM | POA: Diagnosis not present

## 2022-01-13 LAB — RAD ONC ARIA SESSION SUMMARY
Course Elapsed Days: 7
Plan Fractions Treated to Date: 6
Plan Prescribed Dose Per Fraction: 2.5 Gy
Plan Total Fractions Prescribed: 28
Plan Total Prescribed Dose: 70 Gy
Reference Point Dosage Given to Date: 15 Gy
Reference Point Session Dosage Given: 2.5 Gy
Session Number: 6

## 2022-01-14 ENCOUNTER — Ambulatory Visit
Admission: RE | Admit: 2022-01-14 | Discharge: 2022-01-14 | Disposition: A | Discharge status: Home / Self Care | Payer: Medicare Other | Source: Ambulatory Visit | Attending: Radiation Oncology | Admitting: Radiation Oncology

## 2022-01-14 ENCOUNTER — Ambulatory Visit: Payer: Medicare Other

## 2022-01-14 ENCOUNTER — Other Ambulatory Visit: Payer: Self-pay

## 2022-01-14 DIAGNOSIS — Z510 Encounter for antineoplastic radiation therapy: Secondary | ICD-10-CM | POA: Diagnosis not present

## 2022-01-14 LAB — RAD ONC ARIA SESSION SUMMARY
Course Elapsed Days: 8
Plan Fractions Treated to Date: 7
Plan Prescribed Dose Per Fraction: 2.5 Gy
Plan Total Fractions Prescribed: 28
Plan Total Prescribed Dose: 70 Gy
Reference Point Dosage Given to Date: 17.5 Gy
Reference Point Session Dosage Given: 2.5 Gy
Session Number: 7

## 2022-01-15 ENCOUNTER — Other Ambulatory Visit: Payer: Self-pay

## 2022-01-15 ENCOUNTER — Ambulatory Visit
Admission: RE | Admit: 2022-01-15 | Discharge: 2022-01-15 | Disposition: A | Discharge status: Home / Self Care | Payer: Medicare Other | Source: Ambulatory Visit | Attending: Radiation Oncology | Admitting: Radiation Oncology

## 2022-01-15 ENCOUNTER — Ambulatory Visit: Payer: Medicare Other

## 2022-01-15 DIAGNOSIS — Z510 Encounter for antineoplastic radiation therapy: Secondary | ICD-10-CM | POA: Diagnosis not present

## 2022-01-15 LAB — RAD ONC ARIA SESSION SUMMARY
Course Elapsed Days: 9
Plan Fractions Treated to Date: 8
Plan Prescribed Dose Per Fraction: 2.5 Gy
Plan Total Fractions Prescribed: 28
Plan Total Prescribed Dose: 70 Gy
Reference Point Dosage Given to Date: 20 Gy
Reference Point Session Dosage Given: 2.5 Gy
Session Number: 8

## 2022-01-18 ENCOUNTER — Other Ambulatory Visit: Payer: Self-pay

## 2022-01-18 ENCOUNTER — Ambulatory Visit
Admission: RE | Admit: 2022-01-18 | Discharge: 2022-01-18 | Disposition: A | Discharge status: Home / Self Care | Payer: Medicare Other | Source: Ambulatory Visit | Attending: Radiation Oncology | Admitting: Radiation Oncology

## 2022-01-18 ENCOUNTER — Ambulatory Visit: Payer: Medicare Other

## 2022-01-18 DIAGNOSIS — Z510 Encounter for antineoplastic radiation therapy: Secondary | ICD-10-CM | POA: Diagnosis not present

## 2022-01-18 LAB — RAD ONC ARIA SESSION SUMMARY
Course Elapsed Days: 12
Plan Fractions Treated to Date: 9
Plan Prescribed Dose Per Fraction: 2.5 Gy
Plan Total Fractions Prescribed: 28
Plan Total Prescribed Dose: 70 Gy
Reference Point Dosage Given to Date: 22.5 Gy
Reference Point Session Dosage Given: 2.5 Gy
Session Number: 9

## 2022-01-19 ENCOUNTER — Ambulatory Visit: Payer: Medicare Other

## 2022-01-19 ENCOUNTER — Ambulatory Visit
Admission: RE | Admit: 2022-01-19 | Discharge: 2022-01-19 | Disposition: A | Discharge status: Home / Self Care | Payer: Medicare Other | Source: Ambulatory Visit | Attending: Radiation Oncology | Admitting: Radiation Oncology

## 2022-01-19 ENCOUNTER — Other Ambulatory Visit: Payer: Self-pay

## 2022-01-19 DIAGNOSIS — Z510 Encounter for antineoplastic radiation therapy: Secondary | ICD-10-CM | POA: Diagnosis not present

## 2022-01-19 LAB — RAD ONC ARIA SESSION SUMMARY
Course Elapsed Days: 13
Plan Fractions Treated to Date: 10
Plan Prescribed Dose Per Fraction: 2.5 Gy
Plan Total Fractions Prescribed: 28
Plan Total Prescribed Dose: 70 Gy
Reference Point Dosage Given to Date: 25 Gy
Reference Point Session Dosage Given: 2.5 Gy
Session Number: 10

## 2022-01-20 ENCOUNTER — Ambulatory Visit
Admission: RE | Admit: 2022-01-20 | Discharge: 2022-01-20 | Disposition: A | Discharge status: Home / Self Care | Payer: Medicare Other | Source: Ambulatory Visit | Attending: Radiation Oncology | Admitting: Radiation Oncology

## 2022-01-20 ENCOUNTER — Ambulatory Visit: Payer: Medicare Other

## 2022-01-20 ENCOUNTER — Other Ambulatory Visit: Payer: Self-pay

## 2022-01-20 DIAGNOSIS — Z510 Encounter for antineoplastic radiation therapy: Secondary | ICD-10-CM | POA: Diagnosis not present

## 2022-01-20 LAB — RAD ONC ARIA SESSION SUMMARY
Course Elapsed Days: 14
Plan Fractions Treated to Date: 11
Plan Prescribed Dose Per Fraction: 2.5 Gy
Plan Total Fractions Prescribed: 28
Plan Total Prescribed Dose: 70 Gy
Reference Point Dosage Given to Date: 27.5 Gy
Reference Point Session Dosage Given: 2.5 Gy
Session Number: 11

## 2022-01-21 ENCOUNTER — Other Ambulatory Visit: Payer: Self-pay

## 2022-01-21 ENCOUNTER — Ambulatory Visit: Payer: Medicare Other

## 2022-01-21 ENCOUNTER — Ambulatory Visit
Admission: RE | Admit: 2022-01-21 | Discharge: 2022-01-21 | Disposition: A | Discharge status: Home / Self Care | Payer: Medicare Other | Source: Ambulatory Visit | Attending: Radiation Oncology | Admitting: Radiation Oncology

## 2022-01-21 DIAGNOSIS — Z510 Encounter for antineoplastic radiation therapy: Secondary | ICD-10-CM | POA: Diagnosis not present

## 2022-01-21 LAB — RAD ONC ARIA SESSION SUMMARY
Course Elapsed Days: 15
Plan Fractions Treated to Date: 12
Plan Prescribed Dose Per Fraction: 2.5 Gy
Plan Total Fractions Prescribed: 28
Plan Total Prescribed Dose: 70 Gy
Reference Point Dosage Given to Date: 30 Gy
Reference Point Session Dosage Given: 2.5 Gy
Session Number: 12

## 2022-01-22 ENCOUNTER — Other Ambulatory Visit: Payer: Self-pay

## 2022-01-22 ENCOUNTER — Ambulatory Visit: Payer: Medicare Other

## 2022-01-22 ENCOUNTER — Ambulatory Visit
Admission: RE | Admit: 2022-01-22 | Discharge: 2022-01-22 | Disposition: A | Discharge status: Home / Self Care | Payer: Medicare Other | Source: Ambulatory Visit | Attending: Radiation Oncology | Admitting: Radiation Oncology

## 2022-01-22 DIAGNOSIS — Z510 Encounter for antineoplastic radiation therapy: Secondary | ICD-10-CM | POA: Diagnosis not present

## 2022-01-22 LAB — RAD ONC ARIA SESSION SUMMARY
Course Elapsed Days: 16
Plan Fractions Treated to Date: 13
Plan Prescribed Dose Per Fraction: 2.5 Gy
Plan Total Fractions Prescribed: 28
Plan Total Prescribed Dose: 70 Gy
Reference Point Dosage Given to Date: 32.5 Gy
Reference Point Session Dosage Given: 2.5 Gy
Session Number: 13

## 2022-01-26 ENCOUNTER — Ambulatory Visit
Admission: RE | Admit: 2022-01-26 | Discharge: 2022-01-26 | Disposition: A | Discharge status: Home / Self Care | Payer: Medicare Other | Source: Ambulatory Visit | Attending: Radiation Oncology | Admitting: Radiation Oncology

## 2022-01-26 ENCOUNTER — Other Ambulatory Visit: Payer: Self-pay

## 2022-01-26 ENCOUNTER — Ambulatory Visit: Payer: Medicare Other

## 2022-01-26 DIAGNOSIS — Z510 Encounter for antineoplastic radiation therapy: Secondary | ICD-10-CM | POA: Diagnosis not present

## 2022-01-26 LAB — RAD ONC ARIA SESSION SUMMARY
Course Elapsed Days: 20
Plan Fractions Treated to Date: 14
Plan Prescribed Dose Per Fraction: 2.5 Gy
Plan Total Fractions Prescribed: 28
Plan Total Prescribed Dose: 70 Gy
Reference Point Dosage Given to Date: 35 Gy
Reference Point Session Dosage Given: 2.5 Gy
Session Number: 14

## 2022-01-27 ENCOUNTER — Ambulatory Visit: Payer: Medicare Other

## 2022-01-27 ENCOUNTER — Other Ambulatory Visit: Payer: Self-pay

## 2022-01-27 ENCOUNTER — Ambulatory Visit
Admission: RE | Admit: 2022-01-27 | Discharge: 2022-01-27 | Disposition: A | Discharge status: Home / Self Care | Payer: Medicare Other | Source: Ambulatory Visit | Attending: Radiation Oncology | Admitting: Radiation Oncology

## 2022-01-27 DIAGNOSIS — Z510 Encounter for antineoplastic radiation therapy: Secondary | ICD-10-CM | POA: Diagnosis not present

## 2022-01-27 LAB — RAD ONC ARIA SESSION SUMMARY
Course Elapsed Days: 21
Plan Fractions Treated to Date: 15
Plan Prescribed Dose Per Fraction: 2.5 Gy
Plan Total Fractions Prescribed: 28
Plan Total Prescribed Dose: 70 Gy
Reference Point Dosage Given to Date: 37.5 Gy
Reference Point Session Dosage Given: 2.5 Gy
Session Number: 15

## 2022-01-28 ENCOUNTER — Ambulatory Visit
Admission: RE | Admit: 2022-01-28 | Discharge: 2022-01-28 | Disposition: A | Discharge status: Home / Self Care | Payer: Medicare Other | Source: Ambulatory Visit | Attending: Radiation Oncology | Admitting: Radiation Oncology

## 2022-01-28 ENCOUNTER — Other Ambulatory Visit: Payer: Self-pay

## 2022-01-28 ENCOUNTER — Ambulatory Visit: Payer: Medicare Other

## 2022-01-28 DIAGNOSIS — C61 Malignant neoplasm of prostate: Secondary | ICD-10-CM | POA: Insufficient documentation

## 2022-01-28 DIAGNOSIS — Z510 Encounter for antineoplastic radiation therapy: Secondary | ICD-10-CM | POA: Insufficient documentation

## 2022-01-28 LAB — RAD ONC ARIA SESSION SUMMARY
Course Elapsed Days: 22
Plan Fractions Treated to Date: 16
Plan Prescribed Dose Per Fraction: 2.5 Gy
Plan Total Fractions Prescribed: 28
Plan Total Prescribed Dose: 70 Gy
Reference Point Dosage Given to Date: 40 Gy
Reference Point Session Dosage Given: 2.5 Gy
Session Number: 16

## 2022-01-29 ENCOUNTER — Ambulatory Visit: Payer: Medicare Other

## 2022-01-29 ENCOUNTER — Other Ambulatory Visit: Payer: Self-pay

## 2022-01-29 ENCOUNTER — Ambulatory Visit
Admission: RE | Admit: 2022-01-29 | Discharge: 2022-01-29 | Disposition: A | Discharge status: Home / Self Care | Payer: Medicare Other | Source: Ambulatory Visit | Attending: Radiation Oncology | Admitting: Radiation Oncology

## 2022-01-29 DIAGNOSIS — Z510 Encounter for antineoplastic radiation therapy: Secondary | ICD-10-CM | POA: Diagnosis not present

## 2022-01-29 LAB — RAD ONC ARIA SESSION SUMMARY
Course Elapsed Days: 23
Plan Fractions Treated to Date: 17
Plan Prescribed Dose Per Fraction: 2.5 Gy
Plan Total Fractions Prescribed: 28
Plan Total Prescribed Dose: 70 Gy
Reference Point Dosage Given to Date: 42.5 Gy
Reference Point Session Dosage Given: 2.5 Gy
Session Number: 17

## 2022-02-01 ENCOUNTER — Other Ambulatory Visit: Payer: Self-pay

## 2022-02-01 ENCOUNTER — Ambulatory Visit: Payer: Medicare Other

## 2022-02-01 ENCOUNTER — Ambulatory Visit
Admission: RE | Admit: 2022-02-01 | Discharge: 2022-02-01 | Disposition: A | Discharge status: Home / Self Care | Payer: Medicare Other | Source: Ambulatory Visit | Attending: Radiation Oncology | Admitting: Radiation Oncology

## 2022-02-01 DIAGNOSIS — Z510 Encounter for antineoplastic radiation therapy: Secondary | ICD-10-CM | POA: Diagnosis not present

## 2022-02-01 LAB — RAD ONC ARIA SESSION SUMMARY
Course Elapsed Days: 26
Plan Fractions Treated to Date: 18
Plan Prescribed Dose Per Fraction: 2.5 Gy
Plan Total Fractions Prescribed: 28
Plan Total Prescribed Dose: 70 Gy
Reference Point Dosage Given to Date: 45 Gy
Reference Point Session Dosage Given: 2.5 Gy
Session Number: 18

## 2022-02-02 ENCOUNTER — Ambulatory Visit
Admission: RE | Admit: 2022-02-02 | Discharge: 2022-02-02 | Disposition: A | Discharge status: Home / Self Care | Payer: Medicare Other | Source: Ambulatory Visit | Attending: Radiation Oncology | Admitting: Radiation Oncology

## 2022-02-02 ENCOUNTER — Other Ambulatory Visit: Payer: Self-pay

## 2022-02-02 ENCOUNTER — Ambulatory Visit: Payer: Medicare Other

## 2022-02-02 DIAGNOSIS — Z510 Encounter for antineoplastic radiation therapy: Secondary | ICD-10-CM | POA: Diagnosis not present

## 2022-02-02 LAB — RAD ONC ARIA SESSION SUMMARY
Course Elapsed Days: 27
Plan Fractions Treated to Date: 19
Plan Prescribed Dose Per Fraction: 2.5 Gy
Plan Total Fractions Prescribed: 28
Plan Total Prescribed Dose: 70 Gy
Reference Point Dosage Given to Date: 47.5 Gy
Reference Point Session Dosage Given: 2.5 Gy
Session Number: 19

## 2022-02-03 ENCOUNTER — Ambulatory Visit: Payer: Medicare Other

## 2022-02-03 ENCOUNTER — Ambulatory Visit
Admission: RE | Admit: 2022-02-03 | Discharge: 2022-02-03 | Disposition: A | Discharge status: Home / Self Care | Payer: Medicare Other | Source: Ambulatory Visit | Attending: Radiation Oncology | Admitting: Radiation Oncology

## 2022-02-03 ENCOUNTER — Other Ambulatory Visit: Payer: Self-pay

## 2022-02-03 DIAGNOSIS — Z510 Encounter for antineoplastic radiation therapy: Secondary | ICD-10-CM | POA: Diagnosis not present

## 2022-02-03 LAB — RAD ONC ARIA SESSION SUMMARY
Course Elapsed Days: 28
Plan Fractions Treated to Date: 20
Plan Prescribed Dose Per Fraction: 2.5 Gy
Plan Total Fractions Prescribed: 28
Plan Total Prescribed Dose: 70 Gy
Reference Point Dosage Given to Date: 50 Gy
Reference Point Session Dosage Given: 2.5 Gy
Session Number: 20

## 2022-02-04 ENCOUNTER — Ambulatory Visit
Admission: RE | Admit: 2022-02-04 | Discharge: 2022-02-04 | Disposition: A | Discharge status: Home / Self Care | Payer: Medicare Other | Source: Ambulatory Visit | Attending: Radiation Oncology | Admitting: Radiation Oncology

## 2022-02-04 ENCOUNTER — Ambulatory Visit: Payer: Medicare Other

## 2022-02-04 ENCOUNTER — Other Ambulatory Visit: Payer: Self-pay

## 2022-02-04 ENCOUNTER — Other Ambulatory Visit: Payer: Self-pay | Admitting: Radiation Oncology

## 2022-02-04 DIAGNOSIS — Z510 Encounter for antineoplastic radiation therapy: Secondary | ICD-10-CM | POA: Diagnosis not present

## 2022-02-04 LAB — RAD ONC ARIA SESSION SUMMARY
Course Elapsed Days: 29
Plan Fractions Treated to Date: 21
Plan Prescribed Dose Per Fraction: 2.5 Gy
Plan Total Fractions Prescribed: 28
Plan Total Prescribed Dose: 70 Gy
Reference Point Dosage Given to Date: 52.5 Gy
Reference Point Session Dosage Given: 2.5 Gy
Session Number: 21

## 2022-02-05 ENCOUNTER — Other Ambulatory Visit: Payer: Self-pay

## 2022-02-05 ENCOUNTER — Ambulatory Visit
Admission: RE | Admit: 2022-02-05 | Discharge: 2022-02-05 | Disposition: A | Discharge status: Home / Self Care | Payer: Medicare Other | Source: Ambulatory Visit | Attending: Radiation Oncology | Admitting: Radiation Oncology

## 2022-02-05 ENCOUNTER — Ambulatory Visit: Payer: Medicare Other

## 2022-02-05 DIAGNOSIS — Z510 Encounter for antineoplastic radiation therapy: Secondary | ICD-10-CM | POA: Diagnosis not present

## 2022-02-05 LAB — RAD ONC ARIA SESSION SUMMARY
Course Elapsed Days: 30
Plan Fractions Treated to Date: 22
Plan Prescribed Dose Per Fraction: 2.5 Gy
Plan Total Fractions Prescribed: 28
Plan Total Prescribed Dose: 70 Gy
Reference Point Dosage Given to Date: 55 Gy
Reference Point Session Dosage Given: 2.5 Gy
Session Number: 22

## 2022-02-08 ENCOUNTER — Other Ambulatory Visit: Payer: Self-pay

## 2022-02-08 ENCOUNTER — Ambulatory Visit
Admission: RE | Admit: 2022-02-08 | Discharge: 2022-02-08 | Disposition: A | Discharge status: Home / Self Care | Payer: Medicare Other | Source: Ambulatory Visit | Attending: Radiation Oncology | Admitting: Radiation Oncology

## 2022-02-08 ENCOUNTER — Ambulatory Visit: Payer: Medicare Other

## 2022-02-08 DIAGNOSIS — Z510 Encounter for antineoplastic radiation therapy: Secondary | ICD-10-CM | POA: Diagnosis not present

## 2022-02-08 LAB — RAD ONC ARIA SESSION SUMMARY
Course Elapsed Days: 33
Plan Fractions Treated to Date: 23
Plan Prescribed Dose Per Fraction: 2.5 Gy
Plan Total Fractions Prescribed: 28
Plan Total Prescribed Dose: 70 Gy
Reference Point Dosage Given to Date: 57.5 Gy
Reference Point Session Dosage Given: 2.5 Gy
Session Number: 23

## 2022-02-09 ENCOUNTER — Ambulatory Visit
Admission: RE | Admit: 2022-02-09 | Discharge: 2022-02-09 | Disposition: A | Discharge status: Home / Self Care | Payer: Medicare Other | Source: Ambulatory Visit | Attending: Radiation Oncology | Admitting: Radiation Oncology

## 2022-02-09 ENCOUNTER — Other Ambulatory Visit: Payer: Self-pay

## 2022-02-09 ENCOUNTER — Ambulatory Visit: Payer: Medicare Other

## 2022-02-09 DIAGNOSIS — Z510 Encounter for antineoplastic radiation therapy: Secondary | ICD-10-CM | POA: Diagnosis not present

## 2022-02-09 LAB — RAD ONC ARIA SESSION SUMMARY
Course Elapsed Days: 34
Plan Fractions Treated to Date: 24
Plan Prescribed Dose Per Fraction: 2.5 Gy
Plan Total Fractions Prescribed: 28
Plan Total Prescribed Dose: 70 Gy
Reference Point Dosage Given to Date: 60 Gy
Reference Point Session Dosage Given: 2.5 Gy
Session Number: 24

## 2022-02-10 ENCOUNTER — Ambulatory Visit: Payer: Medicare Other

## 2022-02-10 ENCOUNTER — Ambulatory Visit
Admission: RE | Admit: 2022-02-10 | Discharge: 2022-02-10 | Disposition: A | Discharge status: Home / Self Care | Payer: Medicare Other | Source: Ambulatory Visit | Attending: Radiation Oncology | Admitting: Radiation Oncology

## 2022-02-10 ENCOUNTER — Other Ambulatory Visit: Payer: Self-pay

## 2022-02-10 DIAGNOSIS — Z510 Encounter for antineoplastic radiation therapy: Secondary | ICD-10-CM | POA: Diagnosis not present

## 2022-02-10 LAB — RAD ONC ARIA SESSION SUMMARY
Course Elapsed Days: 35
Plan Fractions Treated to Date: 25
Plan Prescribed Dose Per Fraction: 2.5 Gy
Plan Total Fractions Prescribed: 28
Plan Total Prescribed Dose: 70 Gy
Reference Point Dosage Given to Date: 62.5 Gy
Reference Point Session Dosage Given: 2.5 Gy
Session Number: 25

## 2022-02-11 ENCOUNTER — Ambulatory Visit
Admission: RE | Admit: 2022-02-11 | Discharge: 2022-02-11 | Disposition: A | Discharge status: Home / Self Care | Payer: Medicare Other | Source: Ambulatory Visit | Attending: Radiation Oncology | Admitting: Radiation Oncology

## 2022-02-11 ENCOUNTER — Ambulatory Visit: Payer: Medicare Other

## 2022-02-11 ENCOUNTER — Other Ambulatory Visit: Payer: Self-pay

## 2022-02-11 DIAGNOSIS — Z510 Encounter for antineoplastic radiation therapy: Secondary | ICD-10-CM | POA: Diagnosis not present

## 2022-02-11 LAB — RAD ONC ARIA SESSION SUMMARY
Course Elapsed Days: 36
Plan Fractions Treated to Date: 26
Plan Prescribed Dose Per Fraction: 2.5 Gy
Plan Total Fractions Prescribed: 28
Plan Total Prescribed Dose: 70 Gy
Reference Point Dosage Given to Date: 65 Gy
Reference Point Session Dosage Given: 2.5 Gy
Session Number: 26

## 2022-02-12 ENCOUNTER — Other Ambulatory Visit: Payer: Self-pay

## 2022-02-12 ENCOUNTER — Ambulatory Visit
Admission: RE | Admit: 2022-02-12 | Discharge: 2022-02-12 | Disposition: A | Discharge status: Home / Self Care | Payer: Medicare Other | Source: Ambulatory Visit | Attending: Radiation Oncology | Admitting: Radiation Oncology

## 2022-02-12 ENCOUNTER — Ambulatory Visit: Payer: Medicare Other

## 2022-02-12 DIAGNOSIS — Z510 Encounter for antineoplastic radiation therapy: Secondary | ICD-10-CM | POA: Diagnosis not present

## 2022-02-12 LAB — RAD ONC ARIA SESSION SUMMARY
Course Elapsed Days: 37
Plan Fractions Treated to Date: 27
Plan Prescribed Dose Per Fraction: 2.5 Gy
Plan Total Fractions Prescribed: 28
Plan Total Prescribed Dose: 70 Gy
Reference Point Dosage Given to Date: 67.5 Gy
Reference Point Session Dosage Given: 2.5 Gy
Session Number: 27

## 2022-02-15 ENCOUNTER — Encounter: Payer: Self-pay | Admitting: Urology

## 2022-02-15 ENCOUNTER — Other Ambulatory Visit: Payer: Self-pay

## 2022-02-15 ENCOUNTER — Ambulatory Visit
Admission: RE | Admit: 2022-02-15 | Discharge: 2022-02-15 | Disposition: A | Discharge status: Home / Self Care | Payer: Medicare Other | Source: Ambulatory Visit | Attending: Radiation Oncology | Admitting: Radiation Oncology

## 2022-02-15 ENCOUNTER — Ambulatory Visit: Payer: Medicare Other

## 2022-02-15 DIAGNOSIS — C61 Malignant neoplasm of prostate: Secondary | ICD-10-CM

## 2022-02-15 DIAGNOSIS — Z510 Encounter for antineoplastic radiation therapy: Secondary | ICD-10-CM | POA: Diagnosis not present

## 2022-02-15 LAB — RAD ONC ARIA SESSION SUMMARY
Course Elapsed Days: 40
Plan Fractions Treated to Date: 28
Plan Prescribed Dose Per Fraction: 2.5 Gy
Plan Total Fractions Prescribed: 28
Plan Total Prescribed Dose: 70 Gy
Reference Point Dosage Given to Date: 70 Gy
Reference Point Session Dosage Given: 2.5 Gy
Session Number: 28

## 2022-02-16 ENCOUNTER — Ambulatory Visit: Payer: Medicare Other

## 2022-02-17 ENCOUNTER — Ambulatory Visit: Payer: Medicare Other

## 2022-02-18 ENCOUNTER — Ambulatory Visit: Payer: Medicare Other

## 2022-02-19 ENCOUNTER — Ambulatory Visit: Payer: Medicare Other

## 2022-03-23 ENCOUNTER — Encounter: Payer: Self-pay | Admitting: Urology

## 2022-03-23 NOTE — Progress Notes (Signed)
Telephone appointment. I verified patient's identity and began nursing interview. Patient reports doing well w/ urinary symptom improvement. No issues reported at this time.  Meaningful use complete. I-PSS score of 3-mild. No urinary management medications. Urology appt- Aug 18th, 2023  Reminded patient of his 8:30am-03/24/22 telephone appointment w/ Ashlyn Bruning PA-C. I left my extension [phone] in case patient needs anything. Patient verbalized understanding.  Patient contact [phone]

## 2022-03-24 ENCOUNTER — Ambulatory Visit
Admission: RE | Admit: 2022-03-24 | Discharge: 2022-03-24 | Disposition: A | Discharge status: Home / Self Care | Payer: Medicare Other | Source: Ambulatory Visit | Attending: Radiation Oncology | Admitting: Radiation Oncology

## 2022-03-24 DIAGNOSIS — C61 Malignant neoplasm of prostate: Secondary | ICD-10-CM | POA: Insufficient documentation

## 2022-03-24 NOTE — Progress Notes (Signed)
  Radiation Oncology         [phone] ________________________________  Name: Ronald Edwards MRN: [id number]  Date: 02/15/2022  DOB: 1946/08/28  End of Treatment Note  Diagnosis:   76 y.o. gentleman with stage T1c adenocarcinoma of the prostate with a Gleason's score of 4+3 and a PSA of 4.8 (adjusted for finasteride)     Indication for treatment:  Curative, Definitive Radiotherapy       Radiation treatment dates:   01/06/22 - 02/15/22  Site/dose:   The prostate was treated to 70 Gy in 28 fractions of 2.5 Gy  Beams/energy:   The patient was treated with IMRT using volumetric arc therapy delivering 6 MV X-rays to clockwise and counterclockwise circumferential arcs with a 90 degree collimator offset to avoid dose scalloping.  Image guidance was performed with daily cone beam CT prior to each fraction to align to gold markers in the prostate and assure proper bladder and rectal fill volumes.  Immobilization was achieved with Body[employer] custom mold.  Narrative: The patient tolerated radiation treatment relatively well with only minor urinary irritation and modest fatigue.  He did report increased nocturia x7, dysuria at the start of his stream, hesitancy and urgency which did improve with Flomax and AZO as needed.  He denied any abdominal pain or bowel issues.  Plan: The patient has completed radiation treatment. He will return to radiation oncology clinic for routine followup in one month. I advised him to call or return sooner if he has any questions or concerns related to his recovery or treatment. ________________________________  Sheral Apley. Tammi Klippel, M.D.

## 2022-03-24 NOTE — Progress Notes (Signed)
Radiation Oncology         [phone] ________________________________  Name: Ronald Edwards MRN: [id number]  Date: 03/24/2022  DOB: February 19, 1946  Post Treatment Note  CC: Derinda Late, MD  Raynelle Bring, MD  Diagnosis:   76 y.o. gentleman with stage T1c adenocarcinoma of the prostate with a Gleason's score of 4+3 and a PSA of 4.8 (adjusted for finasteride)  Interval Since Last Radiation:  5.5 weeks   01/06/22 - 02/15/22: The prostate was treated to 70 Gy in 28 fractions of 2.5 Gy  Narrative:  I spoke with the patient to conduct his routine scheduled 1 month follow up visit via telephone to spare the patient unnecessary potential exposure in the healthcare setting during the current COVID-19 pandemic.  The patient was notified in advance and gave permission to proceed with this visit format.  He tolerated radiation treatment relatively well with only minor urinary irritation and modest fatigue.  He did report increased nocturia x7, dysuria at the start of his stream, hesitancy and urgency which did improve with Flomax and AZO as needed.  He denied any abdominal pain or bowel issues.                              On review of systems, the patient states that he is doing well in general.  His only complaints at this point are related to his psyche, and continuing to grieve the loss of his second wife who passed approximately 1 year ago.  His LUTS continue to gradually improve and he reports that the dysuria has completely resolved.  He reports a healthy appetite and is maintaining his weight.  He denies abdominal pain, nausea, vomiting, diarrhea or constipation.  Overall, he is pleased with his progress to date.  ALLERGIES:  is allergic to lovastatin, ondansetron hcl, penicillins, pravastatin, simvastatin, and sulfa antibiotics.  Meds: Current Outpatient Medications  Medication Sig Dispense Refill   acetaminophen (TYLENOL) 500 MG tablet Take 1,000 mg by mouth every 6 (six) hours as needed.      ezetimibe (ZETIA) 10 MG tablet Take 10 mg by mouth at bedtime. 5 days a week in am     finasteride (PROSCAR) 5 MG tablet Take 1 tablet by mouth daily.     fluticasone (FLONASE) 50 MCG/ACT nasal spray Place 1 spray into both nostrils daily as needed.     ibuprofen (ADVIL,MOTRIN) 200 MG tablet Take 200 mg by mouth. As needed     metoprolol succinate (TOPROL-XL) 25 MG 24 hr tablet Take 25 mg by mouth daily.     Psyllium Husk POWD Mix 1 tbsp in water every night and drink  0   rosuvastatin (CRESTOR) 10 MG tablet Take 10 mg by mouth 2 (two) times a week. On wednesday and Sunday each week @ [facility]     No current facility-administered medications for this encounter.    Physical Findings:  vitals were not taken for this visit.  Pain Assessment Pain Score: 0-No pain/10 Unable to assess due to telephone follow-up visit format.    Lab Findings: Lab Results  Component Value Date   WBC 9.6 02/19/2011   HGB 16.0 12/25/2021   HCT 47.0 12/25/2021   MCV 93.6 02/19/2011   PLT 314.0 02/19/2011     Radiographic Findings: No results found.  Impression/Plan: 1. 76 y.o. gentleman with stage T1c adenocarcinoma of the prostate with a Gleason's score of 4+3 and a PSA of 4.8 (adjusted  for finasteride). He will continue to follow up with urology for ongoing PSA determinations and has an appointment scheduled with Dr. Alinda Money on 04/16/2022. He understands what to expect with regards to PSA monitoring going forward. I will look forward to following his response to treatment via correspondence with urology, and would be happy to continue to participate in his care if clinically indicated. I talked to the patient about what to expect in the future, including his risk for erectile dysfunction and rectal bleeding. I encouraged him to call or return to the office if he has any questions regarding his previous radiation or possible radiation side effects. He was comfortable with this plan and will follow up as needed.      Nicholos Johns, PA-C

## 2022-04-26 ENCOUNTER — Encounter: Payer: Self-pay | Admitting: *Deleted

## 2022-05-14 ENCOUNTER — Encounter: Payer: Self-pay | Admitting: Internal Medicine

## 2022-05-20 ENCOUNTER — Inpatient Hospital Stay: Payer: Medicare Other | Attending: Urology | Admitting: *Deleted

## 2022-05-20 ENCOUNTER — Encounter: Payer: Self-pay | Admitting: *Deleted

## 2022-05-20 DIAGNOSIS — C61 Malignant neoplasm of prostate: Secondary | ICD-10-CM

## 2022-05-20 NOTE — Progress Notes (Signed)
2 Identifiers were used for identification purposes only. No vitals were taken as this was a telephone visit. Pt denies pain today. Pt stated he does have some slight fatigue at times post radiation. He does not exercise currently. Pt lost his wife 6 months ago and has recently decided to start grief counseling to help him move forward. Pt is getting up to bathroom 2 to 6 times per night, but he did say that he drinks several cups of coffee throughout the day. Pt stated urine flow is weak and has more urinary frequency in the morning. Bowels are getting better as far as consistency and the number of stools per day. Last colonoscopy was 06/2021. He will have next colonoscopy this November, 2023. Diet and exercise was discussed. Pt denies smoking / drinking. Vaccinations were reviewed and updated. Next PSA is 11/03/2022. SCP reviewed and completed.

## 2022-05-25 ENCOUNTER — Telehealth: Payer: Self-pay | Admitting: *Deleted

## 2022-05-25 NOTE — Telephone Encounter (Signed)
Pt. In for recall colonoscopy which is scheduled 07/07/22,do he need a OV prior to procedure ?

## 2022-06-06 NOTE — Telephone Encounter (Signed)
He should be okay for a direct procedure.  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  Thank you.

## 2022-06-07 ENCOUNTER — Other Ambulatory Visit: Payer: Self-pay

## 2022-06-07 ENCOUNTER — Telehealth: Payer: Self-pay | Admitting: *Deleted

## 2022-06-07 ENCOUNTER — Ambulatory Visit (AMBULATORY_SURGERY_CENTER): Payer: Self-pay | Admitting: *Deleted

## 2022-06-07 VITALS — Ht 72.0 in | Wt 265.0 lb

## 2022-06-07 DIAGNOSIS — Z1509 Genetic susceptibility to other malignant neoplasm: Secondary | ICD-10-CM

## 2022-06-07 NOTE — Progress Notes (Signed)
Pre visit completed in person.  No egg or soy allergy known to patient  No issues known to pt with past sedation with any surgeries or procedures Patient denies ever being told they had issues or difficulty with intubation  No FH of Malignant Hyperthermia Pt is not on diet pills Pt is not on  home 02  Pt is not on blood thinners  Pt denies issues with constipation  No A fib or A flutter  Pt instructed to use Single[employer].com or GoodRx for a price reduction on prep   

## 2022-06-07 NOTE — Telephone Encounter (Signed)
Hi Dr Carlean Purl, Can we add him as a double , he is currently scheduled 11/8 @ 8 am and would like to keep this early appt if possible.

## 2022-06-07 NOTE — Telephone Encounter (Signed)
Updated to a double

## 2022-06-07 NOTE — Telephone Encounter (Signed)
OK to add the EGD - block the 1000 spot that is open

## 2022-06-29 ENCOUNTER — Encounter: Payer: Self-pay | Admitting: Internal Medicine

## 2022-07-07 ENCOUNTER — Encounter: Payer: Self-pay | Admitting: Internal Medicine

## 2022-07-07 ENCOUNTER — Ambulatory Visit (AMBULATORY_SURGERY_CENTER): Payer: Medicare Other | Admitting: Internal Medicine

## 2022-07-07 VITALS — BP 112/67 | HR 62 | Temp 97.5°F | Resp 12 | Ht 72.0 in | Wt 265.0 lb

## 2022-07-07 DIAGNOSIS — K317 Polyp of stomach and duodenum: Secondary | ICD-10-CM | POA: Diagnosis not present

## 2022-07-07 DIAGNOSIS — Z09 Encounter for follow-up examination after completed treatment for conditions other than malignant neoplasm: Secondary | ICD-10-CM

## 2022-07-07 DIAGNOSIS — Z1509 Genetic susceptibility to other malignant neoplasm: Secondary | ICD-10-CM

## 2022-07-07 DIAGNOSIS — Z800 Family history of malignant neoplasm of digestive organs: Secondary | ICD-10-CM

## 2022-07-07 DIAGNOSIS — Z86010 Personal history of colonic polyps: Secondary | ICD-10-CM

## 2022-07-07 DIAGNOSIS — D122 Benign neoplasm of ascending colon: Secondary | ICD-10-CM

## 2022-07-07 DIAGNOSIS — K635 Polyp of colon: Secondary | ICD-10-CM

## 2022-07-07 DIAGNOSIS — K6389 Other specified diseases of intestine: Secondary | ICD-10-CM | POA: Diagnosis not present

## 2022-07-07 MED ORDER — SODIUM CHLORIDE 0.9 % IV SOLN: 500.0000 mL | Freq: Once | INTRAVENOUS | Status: DC

## 2022-07-07 NOTE — Progress Notes (Signed)
Called to room to assist during endoscopic procedure.  Patient ID and intended procedure confirmed with present staff. Received instructions for my participation in the procedure from the performing physician.  

## 2022-07-07 NOTE — Patient Instructions (Addendum)
Please read handouts provided. Continue present medications. Await pathology results.   YOU HAD AN ENDOSCOPIC PROCEDURE TODAY AT [facility]:   Refer to the procedure report that was given to you for any specific questions about what was found during the examination.  If the procedure report does not answer your questions, please call your gastroenterologist to clarify.  If you requested that your care partner not be given the details of your procedure findings, then the procedure report has been included in a sealed envelope for you to review at your convenience later.  YOU SHOULD EXPECT: Some feelings of bloating in the abdomen. Passage of more gas than usual.  Walking can help get rid of the air that was put into your GI tract during the procedure and reduce the bloating. If you had a lower endoscopy (such as a colonoscopy or flexible sigmoidoscopy) you may notice spotting of blood in your stool or on the toilet paper. If you underwent a bowel prep for your procedure, you may not have a normal bowel movement for a few days.  Please Note:  You might notice some irritation and congestion in your nose or some drainage.  This is from the oxygen used during your procedure.  There is no need for concern and it should clear up in a day or so.  SYMPTOMS TO REPORT IMMEDIATELY:  Following lower endoscopy (colonoscopy or flexible sigmoidoscopy):  Excessive amounts of blood in the stool  Significant tenderness or worsening of abdominal pains  Swelling of the abdomen that is new, acute  Fever of 100F or higher  Following upper endoscopy (EGD)  Vomiting of blood or coffee ground material  New chest pain or pain under the shoulder blades  Painful or persistently difficult swallowing  New shortness of breath  Fever of 100F or higher  Black, tarry-looking stools  For urgent or emergent issues, a gastroenterologist can be reached at any hour by calling [phone]. Do not use  MyChart messaging for urgent concerns.    DIET:  We do recommend a small meal at first, but then you may proceed to your regular diet.  Drink plenty of fluids but you should avoid alcoholic beverages for 24 hours.  ACTIVITY:  You should plan to take it easy for the rest of today and you should NOT DRIVE or use heavy machinery until tomorrow (because of the sedation medicines used during the test).    FOLLOW UP: Our staff will call the number listed on your records the next business day following your procedure.  We will call around 7:15- 8:00 am to check on you and address any questions or concerns that you may have regarding the information given to you following your procedure. If we do not reach you, we will leave a message.     If any biopsies were taken you will be contacted by phone or by letter within the next 1-3 weeks.  Please call us at [phone] if you have not heard about the biopsies in 3 weeks.    SIGNATURES/CONFIDENTIALITY: You and/or your care partner have signed paperwork which will be entered into your electronic medical record.  These signatures attest to the fact that that the information above on your After Visit Summary has been reviewed and is understood.  Full responsibility of the confidentiality of this discharge information lies with you and/or your care-partner.I found and removed one tiny colon polyp - and there were some tiny stomach polyps that I took biopsies  of. They look like the ones I saw in 2021. Not to worry.  I will let you know results and recommendations.  Try taking 1 tablespoon of Metamucil daily to see if that improves bowel habit changes that have occurred after radiation.  I appreciate the opportunity to care for you. Gatha Mayer, MD, [facility]

## 2022-07-07 NOTE — Progress Notes (Signed)
Pt's states no medical or surgical changes since previsit or office visit. 

## 2022-07-07 NOTE — Op Note (Signed)
Ronald Edwards Patient Name: Ronald Edwards Procedure Date: 07/07/2022 7:56 AM MRN: [id number] Endoscopist: Gatha Mayer , MD, [id number] Age: 76 Referring MD:  Date of Birth: 02/21/1946 Gender: Male Account #: [id number] Procedure:                Colonoscopy Indications:              High risk colon cancer surveillance: Personal                            history of hereditary nonpolyposis colorectal                            cancer (Lynch Syndrome) Medicines:                Monitored Anesthesia Care Procedure:                Pre-Anesthesia Assessment:                           - Prior to the procedure, a History and Physical                            was performed, and patient medications and                            allergies were reviewed. The patient's tolerance of                            previous anesthesia was also reviewed. The risks                            and benefits of the procedure and the sedation                            options and risks were discussed with the patient.                            All questions were answered, and informed consent                            was obtained. Prior Anticoagulants: The patient has                            taken no anticoagulant or antiplatelet agents. ASA                            Grade Assessment: II - A patient with mild systemic                            disease. After reviewing the risks and benefits,                            the patient was deemed in satisfactory condition to  undergo the procedure.                           After obtaining informed consent, the colonoscope                            was passed under direct vision. Throughout the                            procedure, the patient's blood pressure, pulse, and                            oxygen saturations were monitored continuously. The                            CF [id number] #[id number] was introduced through  the anus                            and advanced to the the cecum, identified by                            appendiceal orifice and ileocecal valve. The                            colonoscopy was performed without difficulty. The                            patient tolerated the procedure well. The quality                            of the bowel preparation was good. The ileocecal                            valve, appendiceal orifice, and rectum were                            photographed. The bowel preparation used was                            Miralax via split dose instruction. Scope In: 8:24:57 AM Scope Out: 8:38:05 AM Scope Withdrawal Time: 0 hours 10 minutes 50 seconds  Total Procedure Duration: 0 hours 13 minutes 8 seconds  Findings:                 The perianal and digital rectal examinations were                            normal.                           A 4 mm polyp was found in the ascending colon. The                            polyp was sessile. The polyp was removed with a  cold snare. Resection and retrieval were complete.                            Verification of patient identification for the                            specimen was done. Estimated blood loss was minimal.                           Multiple diverticula were found in the entire colon.                           The exam was otherwise without abnormality on                            direct and retroflexion views. Complications:            No immediate complications. Estimated Blood Loss:     Estimated blood loss was minimal. Impression:               - One 4 mm polyp in the ascending colon, removed                            with a cold snare. Resected and retrieved.                           - Diverticulosis in the entire examined colon.                           - The examination was otherwise normal on direct                            and retroflexion views.                            - Personal history of colonic polyps and Lynch                            Syndrome Recommendation:           - Patient has a contact number available for                            emergencies. The signs and symptoms of potential                            delayed complications were discussed with the                            patient. Return to normal activities tomorrow.                            Written discharge instructions were provided to the                            patient.                           -  Resume previous diet.                           - Continue present medications.                           - Await pathology results.                           - Repeat colonoscopy is recommended for                            surveillance. The colonoscopy date will be                            determined after pathology results from today's                            exam become available for review. Gatha Mayer, MD 07/07/2022 8:52:07 AM This report has been signed electronically.

## 2022-07-07 NOTE — Progress Notes (Signed)
[facility] History and Physical   Primary Care Physician:  Derinda Late, MD   Reason for Procedure:   Lynch syndrome  Plan:    EGD and colonoscopy     HPI: Ronald Edwards is a 76 y.o. male here for [facility] screening/surveillance   Past Medical History:  Diagnosis Date   Actinic keratosis    Allergic rhinitis    Anxiety    chronic seasonal allergies 12/23/2021   last few months per pt on 12-23-2021   Depression    since wife of 30years paseeed away march 20222 with Alzheimer's disease   Diverticulitis    Diverticulitis of colon (without mention of hemorrhage)(562.11) 01/06/2011   Diverticulosis    DJD (degenerative joint disease) of lumbar spine    Dyslipidemia    Family history of stomach cancer    Fecal incontinence    Gait disturbance    occ trouble with balance   Headache(784.0)    History of colonic polyps    History of COVID-19 07/2020   asymptomatic   HOH (hard of hearing) both ears    no hearing aides worn   Hyperlipemia    controlled  with meds   Internal hemorrhoids    Left wrist fracture    years ago in high school no surgery done   Low back pain    Lynch syndrome    no problems with   Malaise and fatigue    Muir-Torre syndrome    same as lynch syndrome   Obesity    Osteoarthritis 12/23/2021   both knees per pt   Perianal dermatitis    Peripheral neuropathy 12/23/2021   [place] of both feet , no meds taken   Pneumonia    age 32, none since   Polyneuropathy in other diseases classified elsewhere ([place]) 11/21/2012   Prostate cancer ([place])    PVC's (premature ventricular contractions)    with 4 beat VT   PVC's (premature ventricular contractions)    since 2008 per dr p blomgren lov 02-16-2021   Sciatic nerve pain, left 12/23/2021   cannot lie on left side for long @ times per pt on 12-23-2021   Sebaceous adenoma    Skin cancer    basal cell carcinoma sees dermatology q 6 months   Skin lesion    atypical, left lower leg    Umbilical hernia right 22/48/2500   Wears glasses for reading     Past Surgical History:  Procedure Laterality Date   COLONOSCOPY  2021   COLONOSCOPY W/ POLYPECTOMY  2002; 08/01/2007; 02/09/11   2002: diminutive adenoma(s); 2008: internal hemorrhoids 2012: hyperplastic rectal polyp, left-sided diverticulosis   colonscopy  07/20/2021   GOLD SEED IMPLANT N/A 12/25/2021   Procedure: GOLD SEED IMPLANT;  Surgeon: Ardis Hughs, MD;  [facility];  Service: Urology;  Laterality: N/A;  ONLY NEEDS 30 MINS   HERNIA REPAIR Left 1982   groin on left   PROSTATE BIOPSY     SPACE OAR INSTILLATION N/A 12/25/2021   Procedure: SPACE OAR INSTILLATION;  Surgeon: Ardis Hughs, MD;  Location: [facility];  Service: Urology;  Laterality: N/A;   SQUAMOUS CELL CARCINOMA EXCISION     Skin cancer last removed jan 2023   UMBILICAL HERNIA REPAIR Right 1988   UPPER GASTROINTESTINAL ENDOSCOPY  07/17/2020    Prior to Admission medications   Medication Sig Start Date End Date Taking? Authorizing Provider  ezetimibe (ZETIA) 10 MG tablet Take 10 mg by mouth  at bedtime. 5 days a week in am 03/28/19  Yes [provider]  finasteride (PROSCAR) 5 MG tablet Take 1 tablet by mouth daily. 12/16/17  Yes [provider]  metoprolol succinate (TOPROL-XL) 25 MG 24 hr tablet Take 25 mg by mouth daily.   Yes [provider]  rosuvastatin (CRESTOR) 10 MG tablet Take 10 mg by mouth 2 (two) times a week. On wednesday and Sunday each week @ [facility] 01/03/19  Yes [provider]  acetaminophen (TYLENOL) 500 MG tablet Take 1,000 mg by mouth every 6 (six) hours as needed.    [provider]  fluticasone (FLONASE) 50 MCG/ACT nasal spray Place 1 spray into both nostrils daily as needed. 12/16/17   [provider]  ibuprofen (ADVIL,MOTRIN) 200 MG tablet Take 200 mg by mouth. As needed    [provider]  Psyllium Husk POWD Mix 1 tbsp in  water every night and drink Patient not taking: Reported on 07/07/2022 03/18/11   Gatha Mayer, MD    Current Outpatient Medications  Medication Sig Dispense Refill   ezetimibe (ZETIA) 10 MG tablet Take 10 mg by mouth at bedtime. 5 days a week in am     finasteride (PROSCAR) 5 MG tablet Take 1 tablet by mouth daily.     metoprolol succinate (TOPROL-XL) 25 MG 24 hr tablet Take 25 mg by mouth daily.     rosuvastatin (CRESTOR) 10 MG tablet Take 10 mg by mouth 2 (two) times a week. On wednesday and Sunday each week @ [facility]     acetaminophen (TYLENOL) 500 MG tablet Take 1,000 mg by mouth every 6 (six) hours as needed.     fluticasone (FLONASE) 50 MCG/ACT nasal spray Place 1 spray into both nostrils daily as needed.     ibuprofen (ADVIL,MOTRIN) 200 MG tablet Take 200 mg by mouth. As needed     Psyllium Husk POWD Mix 1 tbsp in water every night and drink (Patient not taking: Reported on 07/07/2022)  0   Current Facility-Administered Medications  Medication Dose Route Frequency Provider Last Rate Last Admin   0.9 %  sodium chloride infusion  500 mL Intravenous Once Gatha Mayer, MD        Allergies as of 07/07/2022 - Review Complete 07/07/2022  Allergen Reaction Noted   Lovastatin Other (See Comments) 01/01/2011   Ondansetron hcl Other (See Comments) 01/01/2011   Penicillins Swelling and Other (See Comments) 01/01/2011   Pravastatin Other (See Comments) 01/01/2011   Simvastatin Other (See Comments) 01/01/2011   Sulfa antibiotics  05/04/2012    Family History  Problem Relation Age of Onset   Coronary artery disease Mother    COPD Mother    Diabetes Mother    Stomach cancer Paternal Grandmother    Lung cancer Father        Had a lung removed, smoker   Pneumonia Father 44       died with this   Other Father        skin moles removed, unsure of pathology   Anxiety disorder Sister        also degenerative spine disease   Allergic rhinitis Daughter        also son x2, all have spine  disorders also   Clotting disorder Son    Heart failure Maternal Grandmother    Uterine cancer Maternal Grandmother        dx in her 31s-80s   Stroke Maternal Grandfather    Stroke Paternal Grandfather  Heart attack Paternal Grandfather    Colon cancer Neg Hx    Colon polyps Neg Hx    Esophageal cancer Neg Hx    Rectal cancer Neg Hx     Social History   Socioeconomic History   Marital status: Widowed    Spouse name: Not on file   Number of children: 3   Years of education: Not on file   Highest education level: Not on file  Occupational History   Occupation: owner used furniture/appliance store  Tobacco Use   Smoking status: Never   Smokeless tobacco: Never  Vaping Use   Vaping Use: Never used  Substance and Sexual Activity   Alcohol use: Not Currently    Comment: infrequently   Drug use: Never   Sexual activity: Not on file  Other Topics Concern   Not on file  Social History Narrative   Not on file   Social Determinants of Health   Financial Resource Strain: Not on file  Food Insecurity: Not on file  Transportation Needs: Not on file  Physical Activity: Not on file  Stress: Not on file  Social Connections: Not on file  Intimate Partner Violence: Not on file    Review of Systems:  All other review of systems negative except as mentioned in the HPI.  Physical Exam: Vital signs BP 111/60   Pulse 90   Temp (!) 97.5 F (36.4 C)   Ht 6' (1.829 m)   Wt 265 lb (120.2 kg)   SpO2 94%   BMI 35.94 kg/m   General:   Alert,  Well-developed, well-nourished, pleasant and cooperative in NAD Lungs:  Clear throughout to auscultation.   Heart:  Regular rate and rhythm; no murmurs, clicks, rubs,  or gallops. Abdomen:  Soft, nontender and nondistended. Normal bowel sounds.   Neuro/Psych:  Alert and cooperative. Normal mood and affect. A and O x 3   '@[employer]'$  Simonne Maffucci, MD, [facility] [phone] (pager) 07/07/2022 8:08 AM@

## 2022-07-07 NOTE — Progress Notes (Signed)
Sedate, gd SR, tolerated procedure well, VSS, report to RN 

## 2022-07-07 NOTE — Op Note (Signed)
[facility] Patient Name: Ronald Edwards Procedure Date: 07/07/2022 7:57 AM MRN: [id number] Endoscopist: Gatha Mayer , MD, [id number] Age: 76 Referring MD:  Date of Birth: Nov 20, 1945 Gender: Male Account #: [id number] Procedure:                Upper GI endoscopy Indications:              Hereditary nonpolyposis colorectal cancer (Lynch                            Syndrome) Medicines:                Monitored Anesthesia Care Procedure:                Pre-Anesthesia Assessment:                           - Prior to the procedure, a History and Physical                            was performed, and patient medications and                            allergies were reviewed. The patient's tolerance of                            previous anesthesia was also reviewed. The risks                            and benefits of the procedure and the sedation                            options and risks were discussed with the patient.                            All questions were answered, and informed consent                            was obtained. Prior Anticoagulants: The patient has                            taken no anticoagulant or antiplatelet agents. ASA                            Grade Assessment: II - A patient with mild systemic                            disease. After reviewing the risks and benefits,                            the patient was deemed in satisfactory condition to                            undergo the procedure.  After obtaining informed consent, the endoscope was                            passed under direct vision. Throughout the                            procedure, the patient's blood pressure, pulse, and                            oxygen saturations were monitored continuously. The                            GIF [id number] was introduced through the                            mouth, and advanced to the second part of  duodenum.                            The upper GI endoscopy was accomplished without                            difficulty. The patient tolerated the procedure                            well. Scope In: Scope Out: Findings:                 Multiple diminutive sessile polyps were found in                            the gastric cardia, fundus and in the gastric body.                            Biopsies were taken with a cold forceps for                            histology. Verification of patient identification                            for the specimen was done. Estimated blood loss was                            minimal.                           Patchy mildly erythematous mucosa was found in the                            gastric antrum.                           The exam was otherwise without abnormality.                           The cardia and gastric fundus were otherwisenormal  on retroflexion. Complications:            No immediate complications. Estimated Blood Loss:     Estimated blood loss was minimal. Impression:               - Multiple gastric polyps. Biopsied. look like                            prior fundic gland polyps                           - Erythematous mucosa in the antrum.                           - The examination was otherwise normal. Recommendation:           - Patient has a contact number available for                            emergencies. The signs and symptoms of potential                            delayed complications were discussed with the                            patient. Return to normal activities tomorrow.                            Written discharge instructions were provided to the                            patient.                           - See the other procedure note for documentation of                            additional recommendations. Gatha Mayer, MD 07/07/2022 8:49:33 AM This report has been  signed electronically.

## 2022-07-08 ENCOUNTER — Telehealth: Payer: Self-pay

## 2022-07-08 NOTE — Telephone Encounter (Signed)
  Follow up Call-     07/07/2022    7:38 AM 07/20/2021    7:33 AM 07/07/2020    8:41 AM  Call back number  Post procedure Call Back phone  # [phone]  Permission to leave phone message Yes Yes Yes     Patient questions:  Do you have a fever, pain , or abdominal swelling? No. Pain Score  0 *  Have you tolerated food without any problems? Yes.    Have you been able to return to your normal activities? Yes.    Do you have any questions about your discharge instructions: Diet   No. Medications  No. Follow up visit  No.  Do you have questions or concerns about your Care? No.  Actions: * If pain score is 4 or above: No action needed, pain <4.

## 2022-07-18 ENCOUNTER — Encounter: Payer: Self-pay | Admitting: Internal Medicine

## 2022-07-19 ENCOUNTER — Ambulatory Visit: Payer: Medicare Other | Admitting: Internal Medicine

## 2022-07-27 ENCOUNTER — Telehealth: Payer: Self-pay | Admitting: Internal Medicine

## 2022-07-27 NOTE — Telephone Encounter (Unsigned)
Patient called seeking path results asked that he get a call after 4 pm today.

## 2022-07-28 NOTE — Telephone Encounter (Signed)
Pt made aware of letter that was created for pt:  Letter was read aloud to pt and sent to pt via mail:  Pt verbalized understanding with all questions answered.

## 2022-08-31 IMAGING — PT NM PET TUM IMG SKULL BASE T - THIGH
1 series · 1 of 1 positions shown · non-contrast
Comparison: None.

CLINICAL DATA: Prostate cancer diagnosed 15 years free air. Recent
prostate cancer biopsy positive for 3+3 and 3+4 prostate
adenocarcinoma.

EXAM:
NUCLEAR MEDICINE PET SKULL BASE TO THIGH
TECHNIQUE: 9.1 mCi F18 Piflufolastat (Pylarify) was injected intravenously.
Full-ring PET imaging was performed from the skull base to thigh
after the radiotracer. CT data was obtained and used for attenuation
correction and anatomic localization.

[Series 1106: results mm oncology reading · 1.0mm · 0.79mm/px · 1 of 1 slices shown]
[im 1/1]
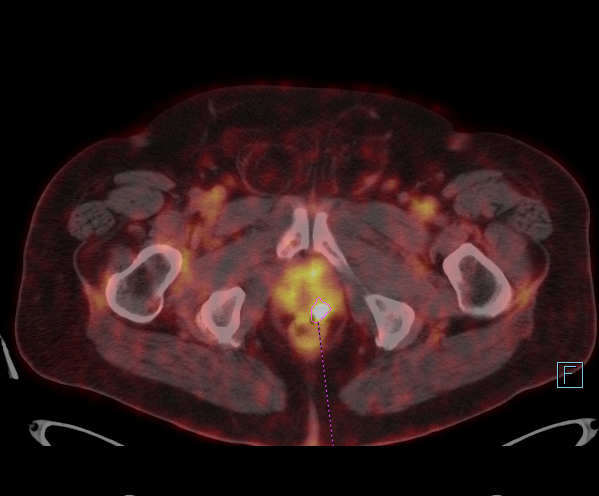

[1 of 1 positions shown; findings below may reference images not displayed]

FINDINGS: NECK

No radiotracer avid lymph nodes in the neck.

Incidental CT finding: None

CHEST

No radiotracer accumulation within mediastinal or hilar lymph nodes.
No suspicious pulmonary nodules on the CT scan.

Incidental CT finding: None

ABDOMEN/PELVIS

Prostate: Focal radiotracer activity in posterior LEFT prostate
gland towards the base with SUV max equal 8.9. This is small region
estimated at 5 to 10 mm in size (image 42). Heterogeneous activity
elsewhere within the prostate gland.

Lymph nodes: No abnormal radiotracer accumulation within pelvic or
abdominal nodes.

Liver: No evidence of liver metastasis

Incidental CT finding: LEFT colon diverticulosis. Bilateral fat
filled inguinal hernias.

SKELETON

No focal  activity to suggest skeletal metastasis.
IMPRESSION: 1. Focal radiotracer activity at the posterior LEFT base concerning
for local prostate adenocarcinoma recurrence.
2. No evidence metastatic adenopathy in the pelvis or periaortic
retroperitoneum.
3. No evidence of visceral metastasis or skeletal metastasis.

## 2023-05-16 ENCOUNTER — Encounter: Payer: Self-pay | Admitting: Internal Medicine

## 2023-06-20 ENCOUNTER — Telehealth: Payer: Self-pay

## 2023-06-20 NOTE — Telephone Encounter (Signed)
Multiple attempts made to contact patient for PV apt. Unable to leave a VM. No show letter will be sent to the address on file if PV is not r/s by end of day.

## 2023-06-29 ENCOUNTER — Encounter: Payer: Self-pay | Admitting: Internal Medicine

## 2023-06-29 ENCOUNTER — Ambulatory Visit (AMBULATORY_SURGERY_CENTER): Payer: Medicare Other

## 2023-06-29 VITALS — Ht 72.0 in | Wt 257.4 lb

## 2023-06-29 DIAGNOSIS — Z1509 Genetic susceptibility to other malignant neoplasm: Secondary | ICD-10-CM

## 2023-06-29 NOTE — Progress Notes (Signed)

## 2023-07-10 NOTE — Progress Notes (Unsigned)
[facility] History and Physical   Primary Care Physician:  Mosetta Putt, MD   Reason for Procedure:  Lynch syndrome  Plan:    Colonoscopy     HPI: Ronald Edwards is a 77 y.o. male with Lynch syndrome who presents for a screening/surveillance colonoscopy.  He does have a history of colon polyps also.   Past Medical History:  Diagnosis Date   Actinic keratosis    Allergic rhinitis    Anxiety    chronic seasonal allergies 12/23/2021   last few months per pt on 12-23-2021   Depression    since wife of 30years paseeed away march [address]00 with Alzheimer's disease   Diverticulitis    Diverticulitis of colon (without mention of hemorrhage)(562.11) 01/06/2011   Diverticulosis    DJD (degenerative joint disease) of lumbar spine    Dyslipidemia    Family history of stomach cancer    Fecal incontinence    Gait disturbance    occ trouble with balance   Headache(784.0)    History of colonic polyps    History of COVID-19 07/2020   asymptomatic   HOH (hard of hearing) both ears    no hearing aides worn   Hyperlipemia    controlled  with meds   Internal hemorrhoids    Left wrist fracture    years ago in high school no surgery done   Low back pain    Lynch syndrome    no problems with   Malaise and fatigue    Muir-Torre syndrome    same as lynch syndrome   Obesity    Osteoarthritis 12/23/2021   both knees per pt   Perianal dermatitis    Peripheral neuropathy 12/23/2021   [place] of both feet , no meds taken   Pneumonia    age 26, none since   Polyneuropathy in other diseases classified elsewhere (HCC) 11/21/2012   Prostate cancer (HCC)    PVC's (premature ventricular contractions)    with 4 beat VT   PVC's (premature ventricular contractions)    since 2008 per dr p blomgren lov 02-16-2021   Sciatic nerve pain, left 12/23/2021   cannot lie on left side for long @ times per pt on 12-23-2021   Sebaceous adenoma    Skin cancer    basal cell carcinoma sees  dermatology q 6 months   Skin lesion    atypical, left lower leg   Umbilical hernia right 12/23/2021   Wears glasses for reading     Past Surgical History:  Procedure Laterality Date   COLONOSCOPY  2021   COLONOSCOPY W/ POLYPECTOMY  2002; 08/01/2007; 02/09/11   2002: diminutive adenoma(s); 2008: internal hemorrhoids 2012: hyperplastic rectal polyp, left-sided diverticulosis   colonscopy  07/20/2021   GOLD SEED IMPLANT N/A 12/25/2021   Procedure: GOLD SEED IMPLANT;  Surgeon: Crist Fat, MD;  Location: [facility];  Service: Urology;  Laterality: N/A;  ONLY NEEDS 30 MINS   HERNIA REPAIR Left 1982   groin on left   PROSTATE BIOPSY     SPACE OAR INSTILLATION N/A 12/25/2021   Procedure: SPACE OAR INSTILLATION;  Surgeon: Crist Fat, MD;  Location: [facility];  Service: Urology;  Laterality: N/A;   SQUAMOUS CELL CARCINOMA EXCISION     Skin cancer last removed jan 2023   UMBILICAL HERNIA REPAIR Right 1988   UPPER GASTROINTESTINAL ENDOSCOPY  07/17/2020    Prior to Admission medications   Medication Sig Start Date End Date Taking? Authorizing  Provider  acetaminophen (TYLENOL) 500 MG tablet Take 1,000 mg by mouth every 6 (six) hours as needed.    [provider]  ezetimibe (ZETIA) 10 MG tablet Take 10 mg by mouth at bedtime. 5 days a week in am 03/28/19   [provider]  finasteride (PROSCAR) 5 MG tablet Take 1 tablet by mouth daily. Patient not taking: Reported on 06/29/2023 12/16/17   [provider]  fluticasone (FLONASE) 50 MCG/ACT nasal spray Place 1 spray into both nostrils daily as needed. 12/16/17   [provider]  ibuprofen (ADVIL,MOTRIN) 200 MG tablet Take 200 mg by mouth. As needed    [provider]  metoprolol succinate (TOPROL-XL) 25 MG 24 hr tablet Take 25 mg by mouth daily.    [provider]  Psyllium Husk POWD Mix 1 tbsp in water every night and drink 03/18/11   Iva Boop,  MD  rosuvastatin (CRESTOR) 5 MG tablet Take 5 mg by mouth daily. Two times a week on Wednesday and Sunday at bedtime 05/25/23   [provider]    Current Outpatient Medications  Medication Sig Dispense Refill   ezetimibe (ZETIA) 10 MG tablet Take 10 mg by mouth at bedtime. 5 days a week in am     metoprolol succinate (TOPROL-XL) 25 MG 24 hr tablet Take 25 mg by mouth daily.     rosuvastatin (CRESTOR) 5 MG tablet Take 5 mg by mouth daily. Two times a week on Wednesday and Sunday at bedtime     acetaminophen (TYLENOL) 500 MG tablet Take 1,000 mg by mouth every 6 (six) hours as needed.     fluticasone (FLONASE) 50 MCG/ACT nasal spray Place 1 spray into both nostrils daily as needed.     ibuprofen (ADVIL,MOTRIN) 200 MG tablet Take 200 mg by mouth. As needed     Psyllium Husk POWD Mix 1 tbsp in water every night and drink  0   Current Facility-Administered Medications  Medication Dose Route Frequency Provider Last Rate Last Admin   0.9 %  sodium chloride infusion  500 mL Intravenous Once Iva Boop, MD        Allergies as of 07/11/2023 - Review Complete 07/11/2023  Allergen Reaction Noted   Lovastatin Other (See Comments) 01/01/2011   Ondansetron hcl Other (See Comments) 01/01/2011   Penicillins Swelling and Other (See Comments) 01/01/2011   Pravastatin Other (See Comments) 01/01/2011   Simvastatin Other (See Comments) 01/01/2011   Sulfa antibiotics  05/04/2012    Family History  Problem Relation Age of Onset   Coronary artery disease Mother    COPD Mother    Diabetes Mother    Stomach cancer Paternal Grandmother    Lung cancer Father        Had a lung removed, smoker   Pneumonia Father 91       died with this   Other Father        skin moles removed, unsure of pathology   Anxiety disorder Sister        also degenerative spine disease   Allergic rhinitis Daughter        also son x2, all have spine disorders also   Clotting disorder Son    Heart failure Maternal  Grandmother    Uterine cancer Maternal Grandmother        dx in her 27s-80s   Stroke Maternal Grandfather    Stroke Paternal Grandfather    Heart attack Paternal Grandfather    Colon cancer Neg  Hx    Colon polyps Neg Hx    Esophageal cancer Neg Hx    Rectal cancer Neg Hx     Social History   Socioeconomic History   Marital status: Widowed    Spouse name: Not on file   Number of children: 3   Years of education: Not on file   Highest education level: Not on file  Occupational History   Occupation: owner used furniture/appliance store  Tobacco Use   Smoking status: Never   Smokeless tobacco: Never  Vaping Use   Vaping status: Never Used  Substance and Sexual Activity   Alcohol use: Not Currently    Comment: infrequently   Drug use: Never   Sexual activity: Not on file  Other Topics Concern   Not on file  Social History Narrative   Not on file   Social Determinants of Health   Financial Resource Strain: Not on file  Food Insecurity: Not on file  Transportation Needs: Not on file  Physical Activity: Not on file  Stress: Not on file  Social Connections: Not on file  Intimate Partner Violence: Not on file    Review of Systems:  All other review of systems negative except as mentioned in the HPI.  Physical Exam: Vital signs BP 118/65   Pulse 87   Temp (!) 97.1 F (36.2 C)   Ht 6' (1.829 m)   Wt 257 lb 6.4 oz (116.8 kg)   SpO2 94%   BMI 34.91 kg/m   General:   Alert,  Well-developed, well-nourished, pleasant and cooperative in NAD Lungs:  Clear throughout to auscultation.   Heart:  Regular rate and rhythm; no murmurs, clicks, rubs,  or gallops. Abdomen:  Soft, nontender and nondistended. Normal bowel sounds.   Neuro/Psych:  Alert and cooperative. Normal mood and affect. A and O x 3   @Ronald Edwards  Sena Slate, MD, [facility] [phone] (pager) 07/11/2023 8:36 AM@

## 2023-07-11 ENCOUNTER — Encounter: Payer: Self-pay | Admitting: Internal Medicine

## 2023-07-11 ENCOUNTER — Ambulatory Visit (AMBULATORY_SURGERY_CENTER): Payer: Medicare Other | Admitting: Internal Medicine

## 2023-07-11 VITALS — BP 105/64 | HR 61 | Temp 97.1°F | Resp 17 | Ht 72.0 in | Wt 257.4 lb

## 2023-07-11 DIAGNOSIS — Z1509 Genetic susceptibility to other malignant neoplasm: Secondary | ICD-10-CM | POA: Diagnosis not present

## 2023-07-11 DIAGNOSIS — Z09 Encounter for follow-up examination after completed treatment for conditions other than malignant neoplasm: Secondary | ICD-10-CM | POA: Diagnosis not present

## 2023-07-11 DIAGNOSIS — D122 Benign neoplasm of ascending colon: Secondary | ICD-10-CM | POA: Diagnosis not present

## 2023-07-11 DIAGNOSIS — Z860100 Personal history of colon polyps, unspecified: Secondary | ICD-10-CM

## 2023-07-11 MED ORDER — SODIUM CHLORIDE 0.9 % IV SOLN: 500.0000 mL | Freq: Once | INTRAVENOUS | Status: DC

## 2023-07-11 NOTE — Progress Notes (Signed)
Vss nad trans to pacu 

## 2023-07-11 NOTE — Patient Instructions (Addendum)
One tiny polyp removed today. Will be analyzed as usual.  Repeat colonoscopy 1 year.  I appreciate the opportunity to care for you. Iva Boop, MD, FACG   YOU HAD AN ENDOSCOPIC PROCEDURE TODAY AT THE [facility]:   Refer to the procedure report that was given to you for any specific questions about what was found during the examination.  If the procedure report does not answer your questions, please call your gastroenterologist to clarify.  If you requested that your care partner not be given the details of your procedure findings, then the procedure report has been included in a sealed envelope for you to review at your convenience later.  YOU SHOULD EXPECT: Some feelings of bloating in the abdomen. Passage of more gas than usual.  Walking can help get rid of the air that was put into your GI tract during the procedure and reduce the bloating. If you had a lower endoscopy (such as a colonoscopy or flexible sigmoidoscopy) you may notice spotting of blood in your stool or on the toilet paper. If you underwent a bowel prep for your procedure, you may not have a normal bowel movement for a few days.  Please Note:  You might notice some irritation and congestion in your nose or some drainage.  This is from the oxygen used during your procedure.  There is no need for concern and it should clear up in a day or so.  SYMPTOMS TO REPORT IMMEDIATELY:  Following lower endoscopy (colonoscopy or flexible sigmoidoscopy):  Excessive amounts of blood in the stool  Significant tenderness or worsening of abdominal pains  Swelling of the abdomen that is new, acute  Fever of 100F or higher  For urgent or emergent issues, a gastroenterologist can be reached at any hour by calling [phone]. Do not use MyChart messaging for urgent concerns.    DIET:  We do recommend a small meal at first, but then you may proceed to your regular diet.  Drink plenty of fluids but you should avoid  alcoholic beverages for 24 hours.  ACTIVITY:  You should plan to take it easy for the rest of today and you should NOT DRIVE or use heavy machinery until tomorrow (because of the sedation medicines used during the test).    FOLLOW UP: Our staff will call the number listed on your records the next business day following your procedure.  We will call around 7:15- 8:00 am to check on you and address any questions or concerns that you may have regarding the information given to you following your procedure. If we do not reach you, we will leave a message.     If any biopsies were taken you will be contacted by phone or by letter within the next 1-3 weeks.  Please call us at [phone] if you have not heard about the biopsies in 3 weeks.    SIGNATURES/CONFIDENTIALITY: You and/or your care partner have signed paperwork which will be entered into your electronic medical record.  These signatures attest to the fact that that the information above on your After Visit Summary has been reviewed and is understood.  Full responsibility of the confidentiality of this discharge information lies with you and/or your care-partner.

## 2023-07-11 NOTE — Progress Notes (Signed)
Called to room to assist during endoscopic procedure.  Patient ID and intended procedure confirmed with present staff. Received instructions for my participation in the procedure from the performing physician.  

## 2023-07-11 NOTE — Op Note (Signed)
[facility] Patient Name: Ronald Edwards Procedure Date: 07/11/2023 8:37 AM MRN: [id number] Endoscopist: Iva Boop , MD, [id number] Age: 77 Referring MD:  Date of Birth: 1946/05/05 Gender: Male Account #: [id number] Procedure:                Colonoscopy Indications:              High risk colon cancer surveillance: Personal                            history of hereditary nonpolyposis colorectal                            cancer (Lynch Syndrome) Medicines:                Monitored Anesthesia Care Procedure:                Pre-Anesthesia Assessment:                           - Prior to the procedure, a History and Physical                            was performed, and patient medications and                            allergies were reviewed. The patient's tolerance of                            previous anesthesia was also reviewed. The risks                            and benefits of the procedure and the sedation                            options and risks were discussed with the patient.                            All questions were answered, and informed consent                            was obtained. Prior Anticoagulants: The patient has                            taken no anticoagulant or antiplatelet agents. ASA                            Grade Assessment: III - A patient with severe                            systemic disease. After reviewing the risks and                            benefits, the patient was deemed in satisfactory  condition to undergo the procedure.                           After obtaining informed consent, the colonoscope                            was passed under direct vision. Throughout the                            procedure, the patient's blood pressure, pulse, and                            oxygen saturations were monitored continuously. The                            CF [id number] #[id number] was introduced  through the anus                            and advanced to the the cecum, identified by                            appendiceal orifice and ileocecal valve. The                            colonoscopy was performed without difficulty. The                            patient tolerated the procedure well. The quality                            of the bowel preparation was good. The ileocecal                            valve, appendiceal orifice, and rectum were                            photographed. The bowel preparation used was                            Miralax via split dose instruction. Scope In: 8:44:36 AM Scope Out: 8:57:07 AM Scope Withdrawal Time: 0 hours 10 minutes 40 seconds  Total Procedure Duration: 0 hours 12 minutes 31 seconds  Findings:                 The perianal and digital rectal examinations were                            normal.                           A 3 mm polyp was found in the ascending colon. The                            polyp was sessile. The polyp was removed with a  cold snare. Resection and retrieval were complete.                            Verification of patient identification for the                            specimen was done. Estimated blood loss was minimal.                           Multiple diverticula were found in the sigmoid                            colon, descending colon and ascending colon.                           The exam was otherwise without abnormality on                            direct and retroflexion views. Complications:            No immediate complications. Estimated Blood Loss:     Estimated blood loss was minimal. Impression:               - One 3 mm polyp in the ascending colon, removed                            with a cold snare. Resected and retrieved.                           - Diverticulosis in the sigmoid colon, in the                            descending colon and in the ascending  colon.                           - The examination was otherwise normal on direct                            and retroflexion views. Recommendation:           - Patient has a contact number available for                            emergencies. The signs and symptoms of potential                            delayed complications were discussed with the                            patient. Return to normal activities tomorrow.                            Written discharge instructions were provided to the  patient.                           - Resume previous diet.                           - Continue present medications.                           - Await pathology results.                           - Repeat colonoscopy in 1 year. Has Lynch Syndrome Iva Boop, MD 07/11/2023 9:04:30 AM This report has been signed electronically.

## 2023-07-12 ENCOUNTER — Telehealth: Payer: Self-pay

## 2023-07-12 NOTE — Telephone Encounter (Signed)
No answer, left message to call if having any issues or concerns, B.Lj Miyamoto RN 

## 2023-07-13 LAB — SURGICAL PATHOLOGY

## 2023-07-14 ENCOUNTER — Encounter: Payer: Self-pay | Admitting: Internal Medicine

## 2023-10-07 ENCOUNTER — Other Ambulatory Visit: Payer: Self-pay | Admitting: Family Medicine

## 2023-10-07 ENCOUNTER — Ambulatory Visit
Admission: RE | Admit: 2023-10-07 | Discharge: 2023-10-07 | Disposition: A | Discharge status: Home / Self Care | Payer: Medicare Other | Source: Ambulatory Visit | Attending: Family Medicine | Admitting: Family Medicine

## 2023-10-07 DIAGNOSIS — R079 Chest pain, unspecified: Secondary | ICD-10-CM

## 2023-10-17 NOTE — Progress Notes (Unsigned)
 Cardiology Office Note:  .   Date:  10/18/2023  ID:  NIRANJAN RUFENER, DOB 1946-07-01, MRN [id number] PCP: Mosetta Putt, MD  [facility] Providers Cardiologist:  Yates Decamp, MD   History of Present Illness: .   Ronald Edwards is a 78 y.o. 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  Discussed the use of AI scribe software for clinical note transcription with the patient, who gave verbal consent to proceed.  History of Present Illness   The patient, with a history of prostate cancer treated with radiation therapy in 2023, hyperlipidemia, prediabetes, peripheral neuropathy, and palpitations managed with metoprolol, presents with chest discomfort and shortness of breath. The symptoms began two to three weeks ago and are associated with exertional activities. The patient describes the chest discomfort as an 'uneasy feeling' located centrally in the chest. The discomfort typically lasts for about a minute to a minute and a half and has improved since starting a daily baby aspirin regimen. The shortness of breath is noticed when walking his dog for about a mile, particularly when walking uphill. The patient denies any chest tightness. He also reports nocturia, needing to urinate two to four times a night. The patient has been splitting wood by hand recently, which is more strenuous activity than he is used to. He has not been doing this hard work since the wood has been split.      Labs   External Labs:  PCP labs 10/17/2023:  BUN 18, serum creatinine 1.25, EGFR 59 mL, potassium 4.7, sodium 136.  LFTs  normal.  Hb 13.6/HCT 43.3, platelets 278, normal indicis.  A1c 5.9%.  Vitamin D30.0.  Total cholesterol 124, triglycerides 74, HDL 45, LDL 64. LDL particle #867  Review of Systems  Cardiovascular:  Positive for chest pain and dyspnea on exertion. Negative for leg swelling.   Physical Exam:   VS:  BP 110/70 (BP Location: Left Arm, Patient Position: Sitting, Cuff Size: Large)   Pulse 61   Resp 16   Ht 6' (1.829 m)   Wt 255 lb 3.2 oz (115.8 kg)   SpO2 96%   BMI 34.61 kg/m    Wt Readings from Last 3 Encounters:  10/18/23 255 lb 3.2 oz (115.8 kg)  07/11/23 257 lb 6.4 oz (116.8 kg)  06/29/23 257 lb 6.4 oz (116.8 kg)    Physical Exam Constitutional:      Appearance: He is obese.  Neck:     Vascular: No carotid bruit or JVD.  Cardiovascular:     Rate and Rhythm: Normal rate and regular rhythm.     Pulses: Intact distal pulses.     Heart sounds: Normal heart sounds. No murmur heard.    No gallop.  Pulmonary:     Effort: Pulmonary effort is normal.     Breath sounds: Normal breath sounds.  Abdominal:     General: Bowel sounds are normal.     Palpations: Abdomen is soft.  Musculoskeletal:     Right lower leg: No edema.     Left lower leg: No edema.    Studies Reviewed: Marland Kitchen    Chest x-ray two-view 10/07/2023: Unchanged cardiac silhouette and mediastinal  contours. There is persistent mild elevation/eventration of the right hemidiaphragm. Minimal left basilar heterogeneous opacities favored to represent atelectasis or scarring. EKG:    EKG Interpretation Date/Time:  Tuesday October 18 2023 08:18:09 EST Ventricular Rate:  61 PR Interval:  168 QRS Duration:  94 QT Interval:  378 QTC Calculation: 380 R Axis:   -16  Text Interpretation: EKG 10/18/2023: Normal sinus rhythm with rate of 61 bpm, leftward axis, no evidence of ischemia, IRBBB No previous ECGs available Confirmed by Delrae Rend [phone]) on 10/18/2023 8:25:38 AM    PCP EKG 10/07/2023: Normal sinus rhythm at rate  of 60 bpm, incomplete right bundle branch block.  Medications and allergies    Allergies  Allergen Reactions   Lovastatin Other (See Comments)    fatigue   Ondansetron Hcl Other (See Comments)    headache   Penicillins Swelling and Other (See Comments)     Sweating- could not walk age 66 after taking per pt.childhood reaction    Pravastatin Other (See Comments)    Arthralgia, Myalgias   Simvastatin Other (See Comments)     Myalgias   Sulfa Antibiotics     Cannot remember reaction     Current Outpatient Medications:    acetaminophen (TYLENOL) 500 MG tablet, Take 1,000 mg by mouth every 6 (six) hours as needed., Disp: , Rfl:    amLODipine (NORVASC) 2.5 MG tablet, Take 1 tablet (2.5 mg total) by mouth every evening., Disp: 30 tablet, Rfl: 2   aspirin EC 81 MG tablet, Take 81 mg by mouth daily. Swallow whole., Disp: , Rfl:    ezetimibe (ZETIA) 10 MG tablet, Take 10 mg by mouth at bedtime. 5 days a week in am, Disp: , Rfl:    fluticasone (FLONASE) 50 MCG/ACT nasal spray, Place 1 spray into both nostrils daily as needed., Disp: , Rfl:    ibuprofen (ADVIL,MOTRIN) 200 MG tablet, Take 200 mg by mouth. As needed, Disp: , Rfl:    ipratropium (ATROVENT) 0.03 % nasal spray, Place 2 sprays into both nostrils every 12 (twelve) hours., Disp: , Rfl:    metoprolol succinate (TOPROL-XL) 25 MG 24 hr tablet, Take 25 mg by mouth daily., Disp: , Rfl:    nitroGLYCERIN (NITROSTAT) 0.4 MG SL tablet, Place 0.4 mg under the tongue every 5 (five) minutes as needed for chest pain., Disp: , Rfl:    Psyllium Husk POWD, Mix 1 tbsp in water every night and drink, Disp: , Rfl: 0   rosuvastatin (CRESTOR) 5 MG tablet, Take 5 mg by mouth daily. Two times a week on Wednesday and Sunday at bedtime, Disp: , Rfl:    ASSESSMENT AND PLAN: .      ICD-10-CM   1. Angina pectoris (HCC)  I20.9 EKG 12-Lead    amLODipine (NORVASC) 2.5 MG tablet    MYOCARDIAL PERFUSION IMAGING    Cardiac Stress Test: Informed Consent Details:  Physician/Practitioner Attestation; Transcribe to consent form and obtain patient signature    2. Dyspnea on exertion  R06.09 MYOCARDIAL PERFUSION IMAGING    3. Hypercholesteremia  E78.00       Assessment and Plan    Chest Discomfort and Shortness of Breath   Intermittent chest discomfort and dyspnea on exertion for the past 2-3 weeks suggest possible cardiac ischemia. Symptoms improve with baby aspirin. EKG and cholesterol levels are normal, but weight is a contributing factor. Discussed starting amlodipine and potential catheterization if significant blockage is found.   A stress test will evaluate coronary artery blockage and guide  treatment.  If stress test is low risk, we could continue medical therapy however if high risk can consider cardiac catheterization depending upon his symptomatology.  Emphasized weight loss for cardiovascular health. Order a stress test, prescribe amlodipine 2.5 mg once daily in the evening, and continue metoprolol 25 mg in the morning. Use nitroglycerin if discomfort lasts more than 5-10 minutes. Follow up in 2 months to review stress test results and symptoms.  Hyperlipidemia   Hyperlipidemia is managed with statins. He is attempting weight loss to potentially discontinue statin therapy, with a goal weight of 225 lbs to improve lipid profile and overall health. Encourage weight loss to target weight.  Lipids reviewed, under excellent care with a combination of Zetia 10 mg daily along with Crestor 5 mg daily.  Prostate Cancer (Post-Radiation Therapy)   Prostate cancer was treated with radiation therapy, completed in 2023. No current issues reported.  Peripheral Neuropathy   Peripheral neuropathy is well-managed.  General Health Maintenance   Vitamin D levels are borderline low. He is a non-smoker with stable weight. Encourage maintaining mental and physical activity. Recommend an over-the-counter vitamin D supplement (1000-2000 units daily) and continued  physical activity and social engagement.  Follow-up   Schedule a stress test and follow up in 2 months to review stress test results and symptoms.         Signed,  Yates Decamp, MD, [facility] 10/18/2023, 8:59 AM [facility] [address] [place], [place] [address] Phone: [phone]. Fax:  [phone]

## 2023-10-18 ENCOUNTER — Encounter: Payer: Self-pay | Admitting: Cardiology

## 2023-10-18 ENCOUNTER — Encounter: Payer: Self-pay | Admitting: *Deleted

## 2023-10-18 ENCOUNTER — Ambulatory Visit: Payer: Medicare Other | Attending: Cardiology | Admitting: Cardiology

## 2023-10-18 VITALS — BP 110/70 | HR 61 | Resp 16 | Ht 72.0 in | Wt 255.2 lb

## 2023-10-18 DIAGNOSIS — E7800 Pure hypercholesterolemia, unspecified: Secondary | ICD-10-CM

## 2023-10-18 DIAGNOSIS — I209 Angina pectoris, unspecified: Secondary | ICD-10-CM

## 2023-10-18 DIAGNOSIS — R0609 Other forms of dyspnea: Secondary | ICD-10-CM | POA: Diagnosis not present

## 2023-10-18 MED ORDER — AMLODIPINE BESYLATE 2.5 MG PO TABS: 2.5000 mg | ORAL_TABLET | Freq: Every evening | ORAL | 2 refills | Status: AC

## 2023-10-18 NOTE — Patient Instructions (Signed)
 Medication Instructions:  Your physician has recommended you make the following change in your medication: Start amlodipine 2.5 mg by mouth daily   *If you need a refill on your cardiac medications before your next appointment, please call your pharmacy*   Lab Work: none If you have labs (blood work) drawn today and your tests are completely normal, you will receive your results only by: MyChart Message (if you have MyChart) OR A paper copy in the mail If you have any lab test that is abnormal or we need to change your treatment, we will call you to review the results.   Testing/Procedures: Your physician has requested that you have an exercise stress myoview. For further information please visit [email or web address] Please follow instruction sheet, as given.    Follow-Up: At [facility], you and your health needs are our priority.  As part of our continuing mission to provide you with exceptional heart care, we have created designated Provider Care Teams.  These Care Teams include your primary Cardiologist (physician) and Advanced Practice Providers (APPs -  Physician Assistants and Nurse Practitioners) who all work together to provide you with the care you need, when you need it.  We recommend signing up for the patient portal called "MyChart".  Sign up information is provided on this After Visit Summary.  MyChart is used to connect with patients for Virtual Visits (Telemedicine).  Patients are able to view lab/test results, encounter notes, upcoming appointments, etc.  Non-urgent messages can be sent to your provider as well.   To learn more about what you can do with MyChart, go to [email or web address].    Your next appointment:   2 month(s)  Provider:   Yates Decamp, MD     Other Instructions

## 2023-10-27 ENCOUNTER — Telehealth (HOSPITAL_COMMUNITY): Payer: Self-pay

## 2023-10-27 NOTE — Telephone Encounter (Signed)
 Detailed instructions left on the patient's answering machine. Asked to call back with any questions.  S.Alessa Mazur CCT

## 2023-11-01 ENCOUNTER — Ambulatory Visit (HOSPITAL_COMMUNITY): Payer: Medicare Other | Attending: Cardiology

## 2023-11-01 DIAGNOSIS — I209 Angina pectoris, unspecified: Secondary | ICD-10-CM | POA: Diagnosis present

## 2023-11-01 DIAGNOSIS — R0609 Other forms of dyspnea: Secondary | ICD-10-CM | POA: Insufficient documentation

## 2023-11-01 LAB — MYOCARDIAL PERFUSION IMAGING
LV dias vol: 87 mL (ref 62–150)
LV sys vol: 40 mL
Nuc Stress EF: 54 %
Peak HR: 71 {beats}/min
Rest HR: 50 {beats}/min
Rest Nuclear Isotope Dose: 10.8 mCi
SDS: 2
SRS: 0
SSS: 2
ST Depression (mm): 0 mm
Stress Nuclear Isotope Dose: 33 mCi
TID: 1.02

## 2023-11-01 MED ORDER — TECHNETIUM TC 99M TETROFOSMIN IV KIT
10.8000 | PACK | Freq: Once | INTRAVENOUS | Status: AC | PRN
  Administered 2023-11-01: 10.8 via INTRAVENOUS

## 2023-11-01 MED ORDER — REGADENOSON 0.4 MG/5ML IV SOLN
0.4000 mg | Freq: Once | INTRAVENOUS | Status: AC
  Administered 2023-11-01: 0.4 mg via INTRAVENOUS

## 2023-11-01 MED ORDER — TECHNETIUM TC 99M TETROFOSMIN IV KIT
33.0000 | PACK | Freq: Once | INTRAVENOUS | Status: AC | PRN
  Administered 2023-11-01: 33 via INTRAVENOUS

## 2023-11-01 NOTE — Progress Notes (Signed)
 Normal stress test

## 2023-11-02 ENCOUNTER — Telehealth: Payer: Self-pay | Admitting: *Deleted

## 2023-11-02 NOTE — Telephone Encounter (Signed)
 Left message to call office

## 2023-11-02 NOTE — Telephone Encounter (Signed)
I spoke with patient and reviewed stress test results with him 

## 2023-11-02 NOTE — Telephone Encounter (Signed)
-----   Message from Nurse Nehemiah Settle S sent at 11/02/2023  1:02 PM EST -----  ----- Message ----- From: Yates Decamp, MD Sent: 11/01/2023   6:07 PM EST To: Dossie Arbour, RN; Cv Div Ch St Triage  Normal stress test

## 2023-11-03 NOTE — Telephone Encounter (Signed)
 Patient calling asking that the results from his stress test go to this PCP office. Please advise

## 2023-11-07 ENCOUNTER — Telehealth: Payer: Self-pay | Admitting: Cardiology

## 2023-11-07 NOTE — Telephone Encounter (Signed)
 Stress test results faxed to Dr Duaine Dredge

## 2023-11-07 NOTE — Telephone Encounter (Signed)
 Patient returned RN's call regarding results.

## 2023-11-07 NOTE — Telephone Encounter (Signed)
 I spoke with patient and let him know results of stress test have been sent to Dr Duaine Dredge

## 2023-12-21 NOTE — Progress Notes (Unsigned)
 Cardiology Office Note:  .   Date:  12/22/2023  ID:  Ronald Edwards, DOB [birth year], MRN [id number] PCP: Candise Chambers, MD  [facility] Providers Cardiologist:  Knox Perl, MD   History of Present Illness: .   Ronald Edwards is a 78 y.o. 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  I had suspected angina pectoris, started him on amlodipine  2.5 mg daily and ordered a nuclear stress test and he now presents for follow-up.  He has not had any further chest pain.  He has noticed easy bruising since being on aspirin.  Discussed the use of AI scribe software for clinical note transcription with the patient, who gave verbal consent to proceed.  History of Present Illness Ronald Edwards, a patient with a history of hyperlipidemia and hypertension, presents for a routine follow-up. He reports no new concerns since his last visit. 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 He denies any cardiac symptoms and attributes any discomfort to overexertion from his physical activities.  He reports profuse bleeding due to daily baby aspirin use. He also mentions ongoing personal stressors, including real estate issues, his ex-wife's recent hip fracture, and his son's health problems, which include two strokes, a mild heart attack, and a recent toe amputation due to infection. Despite these stressors, he has managed to lose weight, down five pounds since his last visit. He is currently on rosuvastatin for hyperlipidemia and amlodipine  for hypertension.  Labs   External Labs:  PCP labs 10/17/2023:   BUN 18, serum creatinine 1.25, EGFR 59 mL, potassium 4.7, sodium 136.  LFTs normal.   Hb 13.6/HCT  43.3, platelets 278, normal indicis.   A1c 5.9%.  Vitamin D30.0.   Total cholesterol 124, triglycerides 74, HDL 45, LDL 64. LDL particle #867  Review of Systems  Cardiovascular:  Negative for chest pain, dyspnea on exertion and leg swelling.   Physical Exam:   VS:  BP 118/76 (BP Location: Left Arm, Patient Position: Sitting, Cuff Size: Normal)   Pulse (!) 106   Ht 6\' 2"  (1.88 m)   Wt 251 lb 9.6 oz (114.1 kg)   SpO2 99%   BMI 32.30 kg/m    Wt Readings from Last 3 Encounters:  12/22/23 251 lb 9.6 oz (114.1 kg)  11/01/23 255 lb (115.7 kg)  10/18/23 255 lb 3.2 oz (115.8 kg)    Physical Exam Neck:     Vascular: No carotid bruit or JVD.  Cardiovascular:     Rate and Rhythm: Normal rate and regular rhythm.     Pulses: Intact distal pulses.     Heart sounds: Normal heart sounds. No murmur heard.    No gallop.  Pulmonary:     Effort: Pulmonary effort is normal.     Breath sounds: Normal breath sounds.  Abdominal:     General: Bowel sounds are normal.     Palpations: Abdomen is soft.  Musculoskeletal:     Right lower leg: No edema.     Left lower leg: No edema.    Studies Reviewed: .    MYOCARDIAL PERFUSION IMAGING with Lexiscan  11/01/2023   The study is normal. The study is low risk.   No ST deviation was noted.   LV perfusion is normal.  There is no evidence of ischemia. There is no evidence of infarction.   Left ventricular function is normal. Nuclear stress EF: 54%. The left ventricular ejection fraction is mildly decreased (45-54%). End diastolic cavity size is normal. End systolic cavity size is normal.   Prior study not available for comparison.  Normal stress nuclear study with inferior thinning but no ischemia; gated EF 54% with normal wall motion.  EKG:        Medications and allergies    Allergies  Allergen Reactions   Lovastatin Other (See Comments)    fatigue   Ondansetron Hcl Other (See Comments)    headache   Penicillins Swelling and Other (See  Comments)     Sweating- could not walk age 88 after taking per pt.childhood reaction    Pravastatin Other (See Comments)    Arthralgia, Myalgias   Simvastatin Other (See Comments)     Myalgias   Sulfa Antibiotics     Cannot remember reaction     Current Outpatient Medications:    acetaminophen  (TYLENOL ) 500 MG tablet, Take 1,000 mg by mouth every 6 (six) hours as needed., Disp: , Rfl:    amLODipine  (NORVASC ) 2.5 MG tablet, Take 1 tablet (2.5 mg total) by mouth every evening., Disp: 30 tablet, Rfl: 2   aspirin EC 81 MG tablet, Take 81 mg by mouth daily. Swallow whole., Disp: , Rfl:    ezetimibe (ZETIA) 10 MG tablet, Take 10 mg by mouth at bedtime. 5 days a week in am, Disp: , Rfl:    fluticasone (FLONASE) 50 MCG/ACT nasal spray, Place 1 spray into both nostrils daily as needed., Disp: , Rfl:    ibuprofen (ADVIL,MOTRIN) 200 MG tablet, Take 200 mg by mouth. As needed, Disp: , Rfl:    ipratropium (ATROVENT) 0.03 % nasal spray, Place 2 sprays into both nostrils every 12 (twelve) hours., Disp: , Rfl:    metoprolol succinate (TOPROL-XL) 25 MG 24 hr tablet, Take 25 mg by mouth daily., Disp: , Rfl:    nitroGLYCERIN (NITROSTAT) 0.4 MG SL tablet, Place 0.4 mg under the tongue every 5 (five) minutes as needed for chest pain., Disp: , Rfl:    Psyllium Husk POWD, Mix 1 tbsp in water every night and drink, Disp: , Rfl: 0   rosuvastatin (CRESTOR) 5 MG tablet, Take 5 mg by mouth daily. Two times a week on Wednesday and Sunday at bedtime, Disp: , Rfl:    No orders of the defined types were placed in this encounter.    There are no discontinued medications.   ASSESSMENT AND PLAN: .      ICD-10-CM   1. Angina pectoris (HCC)  I20.9     2. Dyspnea on exertion  R06.09       Assessment and Plan Assessment & Plan Chest pain suggestive of angina pectoris He has not had any further chest pain, he is tolerating amlodipine  well.  I reviewed the results of the nuclear stress test, he has normal exercise  tolerance and no evidence of ischemia, normal perfusion.  His physical examination is completely normal including his vascular examination.  Hence advised him to continue amlodipine  as he is tolerating this well, blood pressure is also well-controlled and discontinue aspirin as he has noticed significant bruising.  He is very active.  Unless he has recurrence of chest pain I will see him back on a as needed basis.  Hypertension   Hypertension is well-controlled with amlodipine  and metoprolol. No new symptoms or concerns are reported.  Current medications effectively manage blood pressure. Continue amlodipine  and metoprolol as prescribed. Discuss with the primary care physician to prescribe amlodipine  for 90 days during the next visit.  Hyperlipidemia   Hyperlipidemia is well-managed with low-dose rosuvastatin (5 mg). Cholesterol levels are well-controlled, and he responds well to the medication. Continuing rosuvastatin provides cardiovascular protection and reduces the risk of heart attack and stroke. Continue rosuvastatin 5 mg as prescribed.  Signed,  Knox Perl, MD, [facility] 12/22/2023, 8:32 AM [facility] [address] [place], [place] [address] Phone: [phone]. Fax:  [phone]

## 2023-12-22 ENCOUNTER — Encounter: Payer: Self-pay | Admitting: Cardiology

## 2023-12-22 ENCOUNTER — Ambulatory Visit: Payer: Medicare Other | Attending: Cardiology | Admitting: Cardiology

## 2023-12-22 VITALS — BP 118/76 | HR 70 | Ht 74.0 in | Wt 251.6 lb

## 2023-12-22 DIAGNOSIS — R0609 Other forms of dyspnea: Secondary | ICD-10-CM

## 2023-12-22 DIAGNOSIS — I209 Angina pectoris, unspecified: Secondary | ICD-10-CM

## 2023-12-22 NOTE — Patient Instructions (Signed)
 Follow-Up: At [facility], you and your health needs are our priority.  As part of our continuing mission to provide you with exceptional heart care, our providers are all part of one team.  This team includes your primary Cardiologist (physician) and Advanced Practice Providers or APPs (Physician Assistants and Nurse Practitioners) who all work together to provide you with the care you need, when you need it.  Your next appointment:   As needed   Provider:   Knox Perl, MD     We recommend signing up for the patient portal called "MyChart".  Sign up information is provided on this After Visit Summary.  MyChart is used to connect with patients for Virtual Visits (Telemedicine).  Patients are able to view lab/test results, encounter notes, upcoming appointments, etc.  Non-urgent messages can be sent to your provider as well.   To learn more about what you can do with MyChart, go to [email or web address].   Other Instructions      1st Floor: - Lobby - Registration  - Pharmacy  - Lab - Cafe  2nd Floor: - PV Lab - Diagnostic Testing (echo, CT, nuclear med)  3rd Floor: - Vacant  4th Floor: - TCTS (cardiothoracic surgery) - AFib Clinic - Structural Heart Clinic - Vascular Surgery  - Vascular Ultrasound  5th Floor: - HeartCare Cardiology (general and EP) - Clinical Pharmacy for coumadin, hypertension, lipid, weight-loss medications, and med management appointments    Valet parking services will be available as well.

## 2024-05-02 ENCOUNTER — Telehealth: Payer: Self-pay | Admitting: Internal Medicine

## 2024-05-02 ENCOUNTER — Encounter: Payer: Self-pay | Admitting: Internal Medicine

## 2024-05-02 NOTE — Telephone Encounter (Signed)
 Inbound call from patient stating he scheduled his double procedure and would like to know if its the same prep medication he is going to take so he can use his medication card before his 50 dollars expires. Requesting a call back  Please advise  Thank you

## 2024-05-03 NOTE — Telephone Encounter (Signed)
 Call was already taken care of by [facility]

## 2024-05-16 ENCOUNTER — Other Ambulatory Visit: Payer: Self-pay

## 2024-05-16 DIAGNOSIS — Z1509 Genetic susceptibility to other malignant neoplasm: Secondary | ICD-10-CM

## 2024-05-16 MED ORDER — NA SULFATE-K SULFATE-MG SULF 17.5-3.13-1.6 GM/177ML PO SOLN: 1.0000 | Freq: Once | ORAL | 0 refills | Status: AC

## 2024-05-16 NOTE — Telephone Encounter (Signed)
 Spoke with patient. Pt questioning suprep price before his medication card balance expires . Prep has been sent to [facility] per request. If it is not covered put will go with miralax prep as he has been using previously.

## 2024-05-16 NOTE — Telephone Encounter (Signed)
 Inbound call from patient stating he would like to speak to nurse in regards to his prep medication for his upcoming procedure. States he would like to purchase it before the end of the month while he still has his medication card. Requesting a call back  Please advise  thank you

## 2024-06-19 ENCOUNTER — Ambulatory Visit

## 2024-06-19 VITALS — Ht 74.0 in | Wt 269.0 lb

## 2024-06-19 DIAGNOSIS — Z1507 Genetic susceptibility to malignant neoplasm of urinary tract: Secondary | ICD-10-CM

## 2024-06-19 DIAGNOSIS — Z860100 Personal history of colon polyps, unspecified: Secondary | ICD-10-CM

## 2024-06-19 DIAGNOSIS — Z15068 Genetic susceptibility to other malignant neoplasm of digestive system: Secondary | ICD-10-CM

## 2024-06-19 DIAGNOSIS — Z15060 Genetic susceptibility to colorectal cancer: Secondary | ICD-10-CM

## 2024-06-19 DIAGNOSIS — Z1509 Genetic susceptibility to other malignant neoplasm: Secondary | ICD-10-CM

## 2024-06-19 MED ORDER — NA SULFATE-K SULFATE-MG SULF 17.5-3.13-1.6 GM/177ML PO SOLN: 1.0000 | Freq: Once | ORAL | 0 refills | Status: AC

## 2024-06-19 NOTE — Progress Notes (Signed)

## 2024-06-26 ENCOUNTER — Encounter: Payer: Self-pay | Admitting: Internal Medicine

## 2024-07-02 NOTE — Progress Notes (Unsigned)
 [facility] History and Physical   Primary Care Physician:  Windy Coy, MD   Reason for Procedure:    Encounter Diagnoses  Name Primary?   Lynch syndrome Yes   History of colonic polyps      Plan:    Colonoscopy, EGD     HPI: Ronald Edwards is a 78 y.o. male with a history of Lynch syndrome here for a repeat colonoscopy having had 1 last year demonstrating a tubular adenoma that was removed and diverticulosis.  Also here for a repeat EGD last done in 2023.   Past Medical History:  Diagnosis Date   Actinic keratosis    Allergic rhinitis    Anxiety    chronic seasonal allergies 12/23/2021   last few months per pt on 12-23-2021   Depression    since wife of 30years paseeed away march [id number] with Alzheimer's disease   Diverticulitis    Diverticulitis of colon (without mention of hemorrhage)(562.11) 01/06/2011   Diverticulosis    DJD (degenerative joint disease) of lumbar spine    Dyslipidemia    Family history of stomach cancer    Fecal incontinence    Gait disturbance    occ trouble with balance   Headache(784.0)    History of colonic polyps    History of COVID-19 07/2020   asymptomatic   HOH (hard of hearing) both ears    no hearing aides worn   Hyperlipemia    controlled  with meds   Internal hemorrhoids    Left wrist fracture    years ago in high school no surgery done   Low back pain    Lynch syndrome    no problems with   Malaise and fatigue    Muir-Torre syndrome    same as lynch syndrome   Obesity    Osteoarthritis 12/23/2021   both knees per pt   Perianal dermatitis    Peripheral neuropathy 12/23/2021   [place] of both feet , no meds taken   Pneumonia    age 26, none since   Polyneuropathy in other diseases classified elsewhere 11/21/2012   Prostate cancer (HCC)    PVC's (premature ventricular contractions)    with 4 beat VT   PVC's (premature ventricular contractions)    since 2008 per dr p blomgren lov 02-16-2021   Sciatic  nerve pain, left 12/23/2021   cannot lie on left side for long @ times per pt on 12-23-2021   Sebaceous adenoma    Skin cancer    basal cell carcinoma sees dermatology q 6 months   Skin lesion    atypical, left lower leg   Umbilical hernia right 12/23/2021   Wears glasses for reading     Past Surgical History:  Procedure Laterality Date   COLONOSCOPY  2021   COLONOSCOPY W/ POLYPECTOMY  2002; 08/01/2007; 02/09/11   2002: diminutive adenoma(s); 2008: internal hemorrhoids 2012: hyperplastic rectal polyp, left-sided diverticulosis   colonscopy  07/20/2021   GOLD SEED IMPLANT N/A 12/25/2021   Procedure: GOLD SEED IMPLANT;  Surgeon: Cam Morene ORN, MD;  Location: [facility];  Service: Urology;  Laterality: N/A;  ONLY NEEDS 30 MINS   HERNIA REPAIR Left 1982   groin on left   PROSTATE BIOPSY     SPACE OAR INSTILLATION N/A 12/25/2021   Procedure: SPACE OAR INSTILLATION;  Surgeon: Cam Morene ORN, MD;  Location: [facility];  Service: Urology;  Laterality: N/A;   SQUAMOUS CELL CARCINOMA EXCISION  Skin cancer last removed jan 2023   UMBILICAL HERNIA REPAIR Right 1988   UPPER GASTROINTESTINAL ENDOSCOPY  07/17/2020     Current Outpatient Medications  Medication Sig Dispense Refill   acetaminophen  (TYLENOL ) 500 MG tablet Take 1,000 mg by mouth every 6 (six) hours as needed.     amLODipine  (NORVASC ) 2.5 MG tablet Take 1 tablet (2.5 mg total) by mouth every evening. 30 tablet 2   ezetimibe (ZETIA) 10 MG tablet Take 10 mg by mouth at bedtime. 5 days a week in am     fluticasone (FLONASE) 50 MCG/ACT nasal spray Place 1 spray into both nostrils daily as needed.     ibuprofen (ADVIL,MOTRIN) 200 MG tablet Take 200 mg by mouth. As needed     ipratropium (ATROVENT) 0.03 % nasal spray Place 2 sprays into both nostrils every 12 (twelve) hours.     metoprolol succinate (TOPROL-XL) 25 MG 24 hr tablet Take 25 mg by mouth daily.     Psyllium Husk POWD Mix 1 tbsp in  water every night and drink  0   rosuvastatin (CRESTOR) 5 MG tablet Take 5 mg by mouth daily. Two times a week on Wednesday and Sunday at bedtime     aspirin EC 81 MG tablet Take 81 mg by mouth daily. Swallow whole. (Patient not taking: No sig reported)     nitroGLYCERIN (NITROSTAT) 0.4 MG SL tablet Place 0.4 mg under the tongue every 5 (five) minutes as needed for chest pain.     Current Facility-Administered Medications  Medication Dose Route Frequency Provider Last Rate Last Admin   0.9 %  sodium chloride  infusion  500 mL Intravenous Once Avram Lupita BRAVO, MD        Allergies as of 07/03/2024 - Review Complete 07/03/2024  Allergen Reaction Noted   Penicillins Swelling and Other (See Comments) 01/01/2011   Pravastatin Other (See Comments) 01/01/2011   Simvastatin Other (See Comments) 01/01/2011   Lovastatin Other (See Comments) 01/01/2011   Ondansetron hcl Other (See Comments) 01/01/2011   Sulfa antibiotics Other (See Comments) 05/04/2012    Family History  Problem Relation Age of Onset   Coronary artery disease Mother    COPD Mother    Diabetes Mother    Stomach cancer Paternal Grandmother    Lung cancer Father        Had a lung removed, smoker   Pneumonia Father 92       died with this   Other Father        skin moles removed, unsure of pathology   Anxiety disorder Sister        also degenerative spine disease   Allergic rhinitis Daughter        also son x2, all have spine disorders also   Clotting disorder Son    Heart failure Maternal Grandmother    Uterine cancer Maternal Grandmother        dx in her 27s-80s   Stroke Maternal Grandfather    Stroke Paternal Grandfather    Heart attack Paternal Grandfather    Colon cancer Neg Hx    Colon polyps Neg Hx    Esophageal cancer Neg Hx    Rectal cancer Neg Hx     Social History   Socioeconomic History   Marital status: Widowed    Spouse name: Not on file   Number of children: 3   Years of education: Not on file    Highest education level: Not on file  Occupational History  Occupation: network engineer used biochemist, clinical  Tobacco Use   Smoking status: Never   Smokeless tobacco: Never  Vaping Use   Vaping status: Never Used  Substance and Sexual Activity   Alcohol use: Not Currently    Comment: infrequently   Drug use: Never   Sexual activity: Not on file  Other Topics Concern   Not on file  Social History Narrative   Not on file   Social Drivers of Health   Financial Resource Strain: Not on file  Food Insecurity: Not on file  Transportation Needs: Not on file  Physical Activity: Not on file  Stress: Not on file  Social Connections: Not on file  Intimate Partner Violence: Not on file    Review of Systems:  All other review of systems negative except as mentioned in the HPI.  Physical Exam: Vital signs BP 124/61   Pulse (!) 46   Temp (!) 97.3 F (36.3 C) (Temporal)   Resp 12   Ht 6' 2 (1.88 m)   Wt 269 lb (122 kg)   SpO2 100%   BMI 34.54 kg/m   General:   Alert,  Well-developed, well-nourished, pleasant and cooperative in NAD Lungs:  Clear throughout to auscultation.   Heart:  Regular rate and rhythm; no murmurs, clicks, rubs,  or gallops. Abdomen:  Soft, nontender and nondistended. Normal bowel sounds.   Neuro/Psych:  Alert and cooperative. Normal mood and affect. A and O x 3   @Kevontay Burks  CHARLENA Commander, MD, [facility] [phone] (pager) 07/03/2024 9:46 AM@

## 2024-07-03 ENCOUNTER — Encounter: Payer: Self-pay | Admitting: Internal Medicine

## 2024-07-03 ENCOUNTER — Ambulatory Visit: Admitting: Internal Medicine

## 2024-07-03 VITALS — BP 123/62 | HR 50 | Temp 97.3°F | Resp 13 | Ht 74.0 in | Wt 269.0 lb

## 2024-07-03 DIAGNOSIS — Z860100 Personal history of colon polyps, unspecified: Secondary | ICD-10-CM

## 2024-07-03 DIAGNOSIS — K2950 Unspecified chronic gastritis without bleeding: Secondary | ICD-10-CM

## 2024-07-03 DIAGNOSIS — K2970 Gastritis, unspecified, without bleeding: Secondary | ICD-10-CM

## 2024-07-03 DIAGNOSIS — Z860101 Personal history of adenomatous and serrated colon polyps: Secondary | ICD-10-CM

## 2024-07-03 DIAGNOSIS — Z1211 Encounter for screening for malignant neoplasm of colon: Secondary | ICD-10-CM | POA: Diagnosis not present

## 2024-07-03 DIAGNOSIS — Z1509 Genetic susceptibility to other malignant neoplasm: Secondary | ICD-10-CM

## 2024-07-03 DIAGNOSIS — K31A11 Gastric intestinal metaplasia without dysplasia, involving the antrum: Secondary | ICD-10-CM

## 2024-07-03 DIAGNOSIS — K5730 Diverticulosis of large intestine without perforation or abscess without bleeding: Secondary | ICD-10-CM | POA: Diagnosis not present

## 2024-07-03 DIAGNOSIS — K648 Other hemorrhoids: Secondary | ICD-10-CM | POA: Diagnosis not present

## 2024-07-03 MED ORDER — SODIUM CHLORIDE 0.9 % IV SOLN: 500.0000 mL | Freq: Once | INTRAVENOUS | Status: DC

## 2024-07-03 NOTE — Patient Instructions (Addendum)
 No colon polyps today.  Will repeat a colonoscopy in 1 year.  There was some inflammation in the stomach that I took biopsies of.  I will let you know the results.  I appreciate the opportunity to care for you. Lupita CHARLENA Commander, MD, [facility]  Handouts given: Diverticulosis, Hemorrhoids, Gastritis Resume previous diet. Continue present medications.  Await pathology results. Repeat colonoscopy in 1 year due to Lynch Syndrome  YOU HAD AN ENDOSCOPIC PROCEDURE TODAY AT THE [facility]:   Refer to the procedure report that was given to you for any specific questions about what was found during the examination.  If the procedure report does not answer your questions, please call your gastroenterologist to clarify.  If you requested that your care partner not be given the details of your procedure findings, then the procedure report has been included in a sealed envelope for you to review at your convenience later.  YOU SHOULD EXPECT: Some feelings of bloating in the abdomen. Passage of more gas than usual.  Walking can help get rid of the air that was put into your GI tract during the procedure and reduce the bloating. If you had a lower endoscopy (such as a colonoscopy or flexible sigmoidoscopy) you may notice spotting of blood in your stool or on the toilet paper. If you underwent a bowel prep for your procedure, you may not have a normal bowel movement for a few days.  Please Note:  You might notice some irritation and congestion in your nose or some drainage.  This is from the oxygen used during your procedure.  There is no need for concern and it should clear up in a day or so.  SYMPTOMS TO REPORT IMMEDIATELY:  Following lower endoscopy (colonoscopy or flexible sigmoidoscopy):  Excessive amounts of blood in the stool  Significant tenderness or worsening of abdominal pains  Swelling of the abdomen that is new, acute  Fever of 100F or higher  Following upper endoscopy (EGD)  Vomiting  of blood or coffee ground material  New chest pain or pain under the shoulder blades  Painful or persistently difficult swallowing  New shortness of breath  Fever of 100F or higher  Black, tarry-looking stools  For urgent or emergent issues, a gastroenterologist can be reached at any hour by calling [phone]. Do not use MyChart messaging for urgent concerns.    DIET:  We do recommend a small meal at first, but then you may proceed to your regular diet.  Drink plenty of fluids but you should avoid alcoholic beverages for 24 hours.  ACTIVITY:  You should plan to take it easy for the rest of today and you should NOT DRIVE or use heavy machinery until tomorrow (because of the sedation medicines used during the test).    FOLLOW UP: Our staff will call the number listed on your records the next business day following your procedure.  We will call around 7:15- 8:00 am to check on you and address any questions or concerns that you may have regarding the information given to you following your procedure. If we do not reach you, we will leave a message.     If any biopsies were taken you will be contacted by phone or by letter within the next 1-3 weeks.  Please call us  at [phone] if you have not heard about the biopsies in 3 weeks.    SIGNATURES/CONFIDENTIALITY: You and/or your care partner have signed paperwork which will be entered into your  electronic medical record.  These signatures attest to the fact that that the information above on your After Visit Summary has been reviewed and is understood.  Full responsibility of the confidentiality of this discharge information lies with you and/or your care-partner.

## 2024-07-03 NOTE — Progress Notes (Signed)
 Called to room to assist during endoscopic procedure.  Patient ID and intended procedure confirmed with present staff. Received instructions for my participation in the procedure from the performing physician.

## 2024-07-03 NOTE — Progress Notes (Signed)
 To pacu, VSS. Report to [email or web address]

## 2024-07-03 NOTE — Op Note (Signed)
 [facility] Patient Name: Ronald Edwards Procedure Date: 07/03/2024 9:24 AM MRN: [id number] Endoscopist: Lupita FORBES Commander , MD, [id number] Age: 78 Referring MD:  Date of Birth: 1946/03/17 Gender: Male Account #: [id number] Procedure:                Upper GI endoscopy Indications:              Hereditary nonpolyposis colorectal cancer (Lynch                            Syndrome) Medicines:                Monitored Anesthesia Care Procedure:                Pre-Anesthesia Assessment:                           - Prior to the procedure, a History and Physical                            was performed, and patient medications and                            allergies were reviewed. The patient's tolerance of                            previous anesthesia was also reviewed. The risks                            and benefits of the procedure and the sedation                            options and risks were discussed with the patient.                            All questions were answered, and informed consent                            was obtained. Prior Anticoagulants: The patient has                            taken no anticoagulant or antiplatelet agents. ASA                            Grade Assessment: II - A patient with mild systemic                            disease. After reviewing the risks and benefits,                            the patient was deemed in satisfactory condition to                            undergo the procedure.  After obtaining informed consent, the endoscope was                            passed under direct vision. Throughout the                            procedure, the patient's blood pressure, pulse, and                            oxygen saturations were monitored continuously. The                            GIF [id number] #[id number] was introduced through the                            mouth, and advanced to the second part of  duodenum.                            The upper GI endoscopy was accomplished without                            difficulty. The patient tolerated the procedure                            well. Scope In: Scope Out: Findings:                 Patchy mild inflammation characterized by                            congestion (edema), erosions and erythema was found                            in the prepyloric region of the stomach. Biopsies                            were taken with a cold forceps for histology.                            Verification of patient identification for the                            specimen was done. Estimated blood loss was minimal.                           The exam was otherwise without abnormality.                           The cardia and gastric fundus were normal on                            retroflexion. Complications:            No immediate complications. Estimated Blood Loss:     Estimated blood loss was minimal. Impression:               -  Gastritis. Biopsied.                           - The examination was otherwise normal. (does have                            flat white polyp-likel lesions in stomach -                            biopsied 2023 and were not polyps) Recommendation:           - Patient has a contact number available for                            emergencies. The signs and symptoms of potential                            delayed complications were discussed with the                            patient. Return to normal activities tomorrow.                            Written discharge instructions were provided to the                            patient.                           - Resume previous diet.                           - Continue present medications.                           - Await pathology results.                           - See the other procedure note for documentation of                            additional  recommendations. Lupita FORBES Commander, MD 07/03/2024 10:15:57 AM This report has been signed electronically.

## 2024-07-03 NOTE — Progress Notes (Signed)
 Pt's states no medical or surgical changes since previsit or office visit.

## 2024-07-03 NOTE — Op Note (Signed)
 [facility] Patient Name: Ronald Edwards Procedure Date: 07/03/2024 9:23 AM MRN: [id number] Endoscopist: Lupita FORBES Commander , MD, [id number] Age: 77 Referring MD:  Date of Birth: 1945/10/27 Gender: Male Account #: [id number] Procedure:                Colonoscopy Indications:              High risk colon cancer surveillance: Personal                            history of hereditary nonpolyposis colorectal                            cancer (Lynch Syndrome) + personal hx polyps Medicines:                Monitored Anesthesia Care Procedure:                Pre-Anesthesia Assessment:                           - Prior to the procedure, a History and Physical                            was performed, and patient medications and                            allergies were reviewed. The patient's tolerance of                            previous anesthesia was also reviewed. The risks                            and benefits of the procedure and the sedation                            options and risks were discussed with the patient.                            All questions were answered, and informed consent                            was obtained. Prior Anticoagulants: The patient has                            taken no anticoagulant or antiplatelet agents. ASA                            Grade Assessment: II - A patient with mild systemic                            disease. After reviewing the risks and benefits,                            the patient was deemed in satisfactory condition to  undergo the procedure.                           After obtaining informed consent, the colonoscope                            was passed under direct vision. Throughout the                            procedure, the patient's blood pressure, pulse, and                            oxygen saturations were monitored continuously. The                            Olympus Scope  [id number] was introduced through the                            anus and advanced to the the cecum, identified by                            appendiceal orifice and ileocecal valve. The                            colonoscopy was performed without difficulty. The                            patient tolerated the procedure well. The quality                            of the bowel preparation was good. The ileocecal                            valve, appendiceal orifice, and rectum were                            photographed. The bowel preparation used was SUPREP                            via split dose instruction. Scope In: 9:55:46 AM Scope Out: 10:05:12 AM Scope Withdrawal Time: 0 hours 7 minutes 2 seconds  Total Procedure Duration: 0 hours 9 minutes 26 seconds  Findings:                 The perianal and digital rectal examinations were                            normal. Pertinent negatives include normal prostate                            (size, shape, and consistency).                           Multiple diverticula were found in the sigmoid  colon, descending colon and ascending colon.                           Internal hemorrhoids were found.                           The exam was otherwise without abnormality on                            direct and retroflexion views. Complications:            No immediate complications. Estimated Blood Loss:     Estimated blood loss: none. Impression:               - Diverticulosis in the sigmoid colon, in the                            descending colon and in the ascending colon.                           - Internal hemorrhoids.                           - The examination was otherwise normal on direct                            and retroflexion views.                           - No specimens collected. Recommendation:           - Patient has a contact number available for                            emergencies. The  signs and symptoms of potential                            delayed complications were discussed with the                            patient. Return to normal activities tomorrow.                            Written discharge instructions were provided to the                            patient.                           - Resume previous diet.                           - Continue present medications.                           - Repeat colonoscopy in 1 year due to [facility] Syndrome Lupita FORBES Commander, MD 07/03/2024 10:19:35 AM This report has been signed electronically.

## 2024-07-04 ENCOUNTER — Telehealth: Payer: Self-pay | Admitting: *Deleted

## 2024-07-04 NOTE — Telephone Encounter (Signed)
  Follow up Call-     07/03/2024    7:58 AM 07/11/2023    7:34 AM 07/07/2022    7:38 AM  Call back number  Post procedure Call Back phone  # [phone]  Permission to leave phone message Yes Yes Yes     Patient questions:  Do you have a fever, pain , or abdominal swelling? No. Pain Score  0 *  Have you tolerated food without any problems? Yes.    Have you been able to return to your normal activities? Yes.    Do you have any questions about your discharge instructions: Diet   No. Medications  No. Follow up visit  No.  Do you have questions or concerns about your Care? No.  Actions: * If pain score is 4 or above: No action needed, pain <4.

## 2024-07-05 LAB — SURGICAL PATHOLOGY

## 2024-07-17 ENCOUNTER — Ambulatory Visit: Payer: Self-pay | Admitting: Internal Medicine
